# Patient Record
Sex: Female | Born: 1935 | Race: White | Hispanic: No | Marital: Married | Smoking: Former smoker
Health system: Southern US, Community
[De-identification: ages and names within clinical notes are randomized; demographics above are authoritative.]

## PROBLEM LIST (undated history)

## (undated) DIAGNOSIS — I4891 Unspecified atrial fibrillation: Secondary | ICD-10-CM

## (undated) DIAGNOSIS — I739 Peripheral vascular disease, unspecified: Secondary | ICD-10-CM

## (undated) DIAGNOSIS — F419 Anxiety disorder, unspecified: Secondary | ICD-10-CM

## (undated) DIAGNOSIS — F0390 Unspecified dementia without behavioral disturbance: Secondary | ICD-10-CM

## (undated) DIAGNOSIS — J449 Chronic obstructive pulmonary disease, unspecified: Secondary | ICD-10-CM

## (undated) HISTORY — PX: APPENDECTOMY: SHX54

---

## 2000-01-03 ENCOUNTER — Ambulatory Visit (HOSPITAL_COMMUNITY): Admission: RE | Admit: 2000-01-03 | Discharge: 2000-01-03 | Payer: Self-pay | Admitting: Internal Medicine

## 2000-01-03 ENCOUNTER — Encounter: Payer: Self-pay | Admitting: Internal Medicine

## 2014-09-20 ENCOUNTER — Encounter (HOSPITAL_COMMUNITY): Payer: Self-pay | Admitting: Emergency Medicine

## 2014-09-20 ENCOUNTER — Inpatient Hospital Stay (HOSPITAL_COMMUNITY): Payer: Medicare Other

## 2014-09-20 ENCOUNTER — Emergency Department (HOSPITAL_COMMUNITY): Payer: Medicare Other

## 2014-09-20 ENCOUNTER — Inpatient Hospital Stay (HOSPITAL_COMMUNITY)
Admission: EM | Admit: 2014-09-20 | Discharge: 2014-09-22 | DRG: 193 | Disposition: A | Discharge status: Hospice | Payer: Medicare Other | Attending: Internal Medicine | Admitting: Internal Medicine

## 2014-09-20 DIAGNOSIS — I4891 Unspecified atrial fibrillation: Secondary | ICD-10-CM | POA: Diagnosis present

## 2014-09-20 DIAGNOSIS — F0281 Dementia in other diseases classified elsewhere with behavioral disturbance: Secondary | ICD-10-CM | POA: Diagnosis present

## 2014-09-20 DIAGNOSIS — J9811 Atelectasis: Secondary | ICD-10-CM | POA: Diagnosis present

## 2014-09-20 DIAGNOSIS — J441 Chronic obstructive pulmonary disease with (acute) exacerbation: Secondary | ICD-10-CM | POA: Diagnosis present

## 2014-09-20 DIAGNOSIS — I129 Hypertensive chronic kidney disease with stage 1 through stage 4 chronic kidney disease, or unspecified chronic kidney disease: Secondary | ICD-10-CM | POA: Diagnosis present

## 2014-09-20 DIAGNOSIS — R791 Abnormal coagulation profile: Secondary | ICD-10-CM | POA: Diagnosis present

## 2014-09-20 DIAGNOSIS — E785 Hyperlipidemia, unspecified: Secondary | ICD-10-CM | POA: Diagnosis present

## 2014-09-20 DIAGNOSIS — R0902 Hypoxemia: Secondary | ICD-10-CM

## 2014-09-20 DIAGNOSIS — Z7901 Long term (current) use of anticoagulants: Secondary | ICD-10-CM

## 2014-09-20 DIAGNOSIS — Z79899 Other long term (current) drug therapy: Secondary | ICD-10-CM | POA: Diagnosis not present

## 2014-09-20 DIAGNOSIS — N184 Chronic kidney disease, stage 4 (severe): Secondary | ICD-10-CM | POA: Diagnosis present

## 2014-09-20 DIAGNOSIS — J209 Acute bronchitis, unspecified: Secondary | ICD-10-CM | POA: Diagnosis present

## 2014-09-20 DIAGNOSIS — E875 Hyperkalemia: Secondary | ICD-10-CM | POA: Diagnosis present

## 2014-09-20 DIAGNOSIS — I739 Peripheral vascular disease, unspecified: Secondary | ICD-10-CM | POA: Diagnosis present

## 2014-09-20 DIAGNOSIS — J9601 Acute respiratory failure with hypoxia: Secondary | ICD-10-CM | POA: Insufficient documentation

## 2014-09-20 DIAGNOSIS — R600 Localized edema: Secondary | ICD-10-CM | POA: Diagnosis present

## 2014-09-20 DIAGNOSIS — Z66 Do not resuscitate: Secondary | ICD-10-CM | POA: Diagnosis present

## 2014-09-20 DIAGNOSIS — I10 Essential (primary) hypertension: Secondary | ICD-10-CM | POA: Diagnosis present

## 2014-09-20 DIAGNOSIS — N179 Acute kidney failure, unspecified: Secondary | ICD-10-CM | POA: Diagnosis present

## 2014-09-20 DIAGNOSIS — I517 Cardiomegaly: Secondary | ICD-10-CM | POA: Diagnosis present

## 2014-09-20 DIAGNOSIS — R339 Retention of urine, unspecified: Secondary | ICD-10-CM | POA: Diagnosis present

## 2014-09-20 DIAGNOSIS — Q600 Renal agenesis, unilateral: Secondary | ICD-10-CM | POA: Diagnosis not present

## 2014-09-20 DIAGNOSIS — N189 Chronic kidney disease, unspecified: Secondary | ICD-10-CM

## 2014-09-20 DIAGNOSIS — Z515 Encounter for palliative care: Secondary | ICD-10-CM

## 2014-09-20 DIAGNOSIS — J449 Chronic obstructive pulmonary disease, unspecified: Secondary | ICD-10-CM | POA: Insufficient documentation

## 2014-09-20 DIAGNOSIS — J189 Pneumonia, unspecified organism: Secondary | ICD-10-CM | POA: Diagnosis present

## 2014-09-20 DIAGNOSIS — R0602 Shortness of breath: Secondary | ICD-10-CM | POA: Diagnosis present

## 2014-09-20 DIAGNOSIS — Z87891 Personal history of nicotine dependence: Secondary | ICD-10-CM

## 2014-09-20 DIAGNOSIS — T502X5A Adverse effect of carbonic-anhydrase inhibitors, benzothiadiazides and other diuretics, initial encounter: Secondary | ICD-10-CM | POA: Diagnosis present

## 2014-09-20 DIAGNOSIS — I481 Persistent atrial fibrillation: Secondary | ICD-10-CM

## 2014-09-20 DIAGNOSIS — I5043 Acute on chronic combined systolic (congestive) and diastolic (congestive) heart failure: Secondary | ICD-10-CM | POA: Diagnosis present

## 2014-09-20 DIAGNOSIS — I509 Heart failure, unspecified: Secondary | ICD-10-CM

## 2014-09-20 DIAGNOSIS — F0390 Unspecified dementia without behavioral disturbance: Secondary | ICD-10-CM | POA: Diagnosis present

## 2014-09-20 HISTORY — DX: Chronic obstructive pulmonary disease, unspecified: J44.9

## 2014-09-20 HISTORY — DX: Unspecified dementia, unspecified severity, without behavioral disturbance, psychotic disturbance, mood disturbance, and anxiety: F03.90

## 2014-09-20 HISTORY — DX: Peripheral vascular disease, unspecified: I73.9

## 2014-09-20 HISTORY — DX: Unspecified atrial fibrillation: I48.91

## 2014-09-20 HISTORY — DX: Anxiety disorder, unspecified: F41.9

## 2014-09-20 LAB — I-STAT CHEM 8, ED
BUN: 42 mg/dL — ABNORMAL HIGH (ref 6–23)
CREATININE: 1.5 mg/dL — AB (ref 0.50–1.10)
Calcium, Ion: 1.23 mmol/L (ref 1.13–1.30)
Chloride: 98 mmol/L (ref 96–112)
Glucose, Bld: 110 mg/dL — ABNORMAL HIGH (ref 70–99)
HCT: 45 % (ref 36.0–46.0)
HEMOGLOBIN: 15.3 g/dL — AB (ref 12.0–15.0)
POTASSIUM: 5.2 mmol/L — AB (ref 3.5–5.1)
Sodium: 142 mmol/L (ref 135–145)
TCO2: 33 mmol/L (ref 0–100)

## 2014-09-20 LAB — TROPONIN I
TROPONIN I: 0.03 ng/mL (ref <0.031)
Troponin I: 0.03 ng/mL (ref <0.031)
Troponin I: 0.03 ng/mL (ref <0.031)

## 2014-09-20 LAB — PROTIME-INR
INR: 5.61 — AB (ref 0.00–1.49)
PROTHROMBIN TIME: 51.2 s — AB (ref 11.6–15.2)

## 2014-09-20 LAB — COMPREHENSIVE METABOLIC PANEL
ALT: 42 U/L — AB (ref 0–35)
AST: 49 U/L — AB (ref 0–37)
Albumin: 3.8 g/dL (ref 3.5–5.2)
Alkaline Phosphatase: 70 U/L (ref 39–117)
Anion gap: 3 — ABNORMAL LOW (ref 5–15)
BUN: 40 mg/dL — ABNORMAL HIGH (ref 6–23)
CO2: 38 mmol/L — ABNORMAL HIGH (ref 19–32)
CREATININE: 1.55 mg/dL — AB (ref 0.50–1.10)
Calcium: 9.1 mg/dL (ref 8.4–10.5)
Chloride: 99 mmol/L (ref 96–112)
GFR calc Af Amer: 32 mL/min — ABNORMAL LOW (ref >90)
GFR calc non Af Amer: 28 mL/min — ABNORMAL LOW (ref >90)
Glucose, Bld: 110 mg/dL — ABNORMAL HIGH (ref 70–99)
Potassium: 5.2 mmol/L — ABNORMAL HIGH (ref 3.5–5.1)
Sodium: 140 mmol/L (ref 135–145)
TOTAL PROTEIN: 5.9 g/dL — AB (ref 6.0–8.3)
Total Bilirubin: 0.8 mg/dL (ref 0.3–1.2)

## 2014-09-20 LAB — CBC WITH DIFFERENTIAL/PLATELET
Basophils Absolute: 0 10*3/uL (ref 0.0–0.1)
Basophils Relative: 0 % (ref 0–1)
Eosinophils Absolute: 0.1 10*3/uL (ref 0.0–0.7)
Eosinophils Relative: 2 % (ref 0–5)
HCT: 45 % (ref 36.0–46.0)
Hemoglobin: 13.3 g/dL (ref 12.0–15.0)
Lymphocytes Relative: 10 % — ABNORMAL LOW (ref 12–46)
Lymphs Abs: 0.6 10*3/uL — ABNORMAL LOW (ref 0.7–4.0)
MCH: 27.1 pg (ref 26.0–34.0)
MCHC: 29.6 g/dL — ABNORMAL LOW (ref 30.0–36.0)
MCV: 91.8 fL (ref 78.0–100.0)
Monocytes Absolute: 1 10*3/uL (ref 0.1–1.0)
Monocytes Relative: 18 % — ABNORMAL HIGH (ref 3–12)
Neutro Abs: 4 10*3/uL (ref 1.7–7.7)
Neutrophils Relative %: 70 % (ref 43–77)
Platelets: 159 10*3/uL (ref 150–400)
RBC: 4.9 MIL/uL (ref 3.87–5.11)
RDW: 17.8 % — ABNORMAL HIGH (ref 11.5–15.5)
WBC: 5.7 10*3/uL (ref 4.0–10.5)

## 2014-09-20 LAB — I-STAT TROPONIN, ED: TROPONIN I, POC: 0.02 ng/mL (ref 0.00–0.08)

## 2014-09-20 LAB — BASIC METABOLIC PANEL
Anion gap: 5 (ref 5–15)
BUN: 44 mg/dL — ABNORMAL HIGH (ref 6–23)
CHLORIDE: 100 mmol/L (ref 96–112)
CO2: 39 mmol/L — AB (ref 19–32)
CREATININE: 1.73 mg/dL — AB (ref 0.50–1.10)
Calcium: 8.5 mg/dL (ref 8.4–10.5)
GFR calc non Af Amer: 24 mL/min — ABNORMAL LOW (ref >90)
GFR, EST AFRICAN AMERICAN: 28 mL/min — AB (ref >90)
GLUCOSE: 171 mg/dL — AB (ref 70–99)
Potassium: 5.1 mmol/L (ref 3.5–5.1)
Sodium: 144 mmol/L (ref 135–145)

## 2014-09-20 LAB — BRAIN NATRIURETIC PEPTIDE: B NATRIURETIC PEPTIDE 5: 1425.4 pg/mL — AB (ref 0.0–100.0)

## 2014-09-20 LAB — MRSA PCR SCREENING: MRSA BY PCR: NEGATIVE

## 2014-09-20 LAB — SODIUM, URINE, RANDOM: Sodium, Ur: 89 mEq/L

## 2014-09-20 LAB — CREATININE, URINE, RANDOM: Creatinine, Urine: 77.2 mg/dL

## 2014-09-20 LAB — STREP PNEUMONIAE URINARY ANTIGEN: STREP PNEUMO URINARY ANTIGEN: NEGATIVE

## 2014-09-20 MED ORDER — CETYLPYRIDINIUM CHLORIDE 0.05 % MT LIQD
7.0000 mL | Freq: Two times a day (BID) | OROMUCOSAL | Status: DC
  Administered 2014-09-20 – 2014-09-22 (×5): 7 mL via OROMUCOSAL

## 2014-09-20 MED ORDER — PANTOPRAZOLE SODIUM 40 MG PO TBEC
40.0000 mg | DELAYED_RELEASE_TABLET | Freq: Every day | ORAL | Status: DC
  Administered 2014-09-20 – 2014-09-21 (×2): 40 mg via ORAL
  Filled 2014-09-20 (×3): qty 1

## 2014-09-20 MED ORDER — RESOURCE INSTANT PROTEIN PO PWD PACKET
2.0000 | Freq: Three times a day (TID) | ORAL | Status: DC
  Administered 2014-09-20 – 2014-09-21 (×4): 12 g via ORAL
  Filled 2014-09-20: qty 227

## 2014-09-20 MED ORDER — METHYLPREDNISOLONE SODIUM SUCC 40 MG IJ SOLR
40.0000 mg | Freq: Three times a day (TID) | INTRAMUSCULAR | Status: DC
  Administered 2014-09-20 – 2014-09-21 (×3): 40 mg via INTRAVENOUS
  Filled 2014-09-20 (×4): qty 1

## 2014-09-20 MED ORDER — CARVEDILOL 3.125 MG PO TABS
3.1250 mg | ORAL_TABLET | Freq: Two times a day (BID) | ORAL | Status: DC
  Administered 2014-09-20 – 2014-09-22 (×4): 3.125 mg via ORAL
  Filled 2014-09-20 (×4): qty 1

## 2014-09-20 MED ORDER — METHYLPREDNISOLONE SODIUM SUCC 125 MG IJ SOLR: 60.0000 mg | Freq: Three times a day (TID) | INTRAMUSCULAR | Status: DC

## 2014-09-20 MED ORDER — POLYVINYL ALCOHOL 1.4 % OP SOLN
1.0000 [drp] | Freq: Three times a day (TID) | OPHTHALMIC | Status: DC
  Administered 2014-09-20 – 2014-09-22 (×7): 1 [drp] via OPHTHALMIC
  Filled 2014-09-20: qty 15

## 2014-09-20 MED ORDER — GUAIFENESIN 200 MG PO TABS
400.0000 mg | ORAL_TABLET | Freq: Four times a day (QID) | ORAL | Status: DC | PRN
  Filled 2014-09-20: qty 2

## 2014-09-20 MED ORDER — SODIUM POLYSTYRENE SULFONATE 15 GM/60ML PO SUSP
15.0000 g | Freq: Once | ORAL | Status: AC
  Administered 2014-09-20: 15 g via ORAL
  Filled 2014-09-20: qty 60

## 2014-09-20 MED ORDER — PREDNISONE 20 MG PO TABS
60.0000 mg | ORAL_TABLET | Freq: Once | ORAL | Status: AC
  Administered 2014-09-20: 60 mg via ORAL
  Filled 2014-09-20: qty 3

## 2014-09-20 MED ORDER — FUROSEMIDE 10 MG/ML IJ SOLN
40.0000 mg | Freq: Once | INTRAMUSCULAR | Status: AC
  Administered 2014-09-20: 40 mg via INTRAVENOUS
  Filled 2014-09-20: qty 4

## 2014-09-20 MED ORDER — PRAVASTATIN SODIUM 20 MG PO TABS
80.0000 mg | ORAL_TABLET | Freq: Every day | ORAL | Status: DC
  Administered 2014-09-20 – 2014-09-21 (×2): 80 mg via ORAL
  Filled 2014-09-20 (×3): qty 4

## 2014-09-20 MED ORDER — HYPROMELLOSE (GONIOSCOPIC) 2.5 % OP SOLN: 1.0000 [drp] | Freq: Three times a day (TID) | OPHTHALMIC | Status: DC

## 2014-09-20 MED ORDER — ALBUTEROL SULFATE (2.5 MG/3ML) 0.083% IN NEBU
2.5000 mg | INHALATION_SOLUTION | RESPIRATORY_TRACT | Status: DC | PRN
  Administered 2014-09-20: 2.5 mg via RESPIRATORY_TRACT
  Filled 2014-09-20: qty 3

## 2014-09-20 MED ORDER — GUAIFENESIN ER 600 MG PO TB12
600.0000 mg | ORAL_TABLET | Freq: Two times a day (BID) | ORAL | Status: DC
  Administered 2014-09-20 – 2014-09-21 (×3): 600 mg via ORAL
  Filled 2014-09-20 (×4): qty 1

## 2014-09-20 MED ORDER — GUAIFENESIN-DM 100-10 MG/5ML PO SYRP
5.0000 mL | ORAL_SOLUTION | ORAL | Status: DC | PRN
  Administered 2014-09-20 (×2): 5 mL via ORAL
  Filled 2014-09-20 (×2): qty 10

## 2014-09-20 MED ORDER — MIRTAZAPINE 15 MG PO TABS
15.0000 mg | ORAL_TABLET | Freq: Every day | ORAL | Status: DC
  Administered 2014-09-20: 15 mg via ORAL
  Filled 2014-09-20 (×2): qty 1

## 2014-09-20 MED ORDER — AMLODIPINE BESYLATE 10 MG PO TABS
10.0000 mg | ORAL_TABLET | Freq: Every day | ORAL | Status: DC
  Administered 2014-09-20 – 2014-09-21 (×2): 10 mg via ORAL
  Filled 2014-09-20 (×3): qty 1

## 2014-09-20 MED ORDER — NUTRITIONAL SUPPLEMENT PO LIQD: 1.0000 | Freq: Three times a day (TID) | ORAL | Status: DC

## 2014-09-20 MED ORDER — FOLIC ACID 1 MG PO TABS
1.0000 mg | ORAL_TABLET | ORAL | Status: DC
  Administered 2014-09-20 – 2014-09-22 (×3): 1 mg via ORAL
  Filled 2014-09-20 (×3): qty 1

## 2014-09-20 MED ORDER — IPRATROPIUM-ALBUTEROL 0.5-2.5 (3) MG/3ML IN SOLN
3.0000 mL | RESPIRATORY_TRACT | Status: DC
  Administered 2014-09-20: 3 mL via RESPIRATORY_TRACT

## 2014-09-20 MED ORDER — ENSURE COMPLETE PO LIQD
237.0000 mL | Freq: Three times a day (TID) | ORAL | Status: DC
  Administered 2014-09-20 – 2014-09-21 (×3): 237 mL via ORAL

## 2014-09-20 MED ORDER — ALBUTEROL (5 MG/ML) CONTINUOUS INHALATION SOLN
10.0000 mg/h | INHALATION_SOLUTION | RESPIRATORY_TRACT | Status: DC
  Administered 2014-09-20: 10 mg/h via RESPIRATORY_TRACT
  Filled 2014-09-20: qty 20

## 2014-09-20 MED ORDER — DONEPEZIL HCL 10 MG PO TABS
10.0000 mg | ORAL_TABLET | ORAL | Status: DC
  Administered 2014-09-20 – 2014-09-22 (×3): 10 mg via ORAL
  Filled 2014-09-20 (×3): qty 1

## 2014-09-20 MED ORDER — BENZONATATE 100 MG PO CAPS
200.0000 mg | ORAL_CAPSULE | Freq: Three times a day (TID) | ORAL | Status: DC
  Administered 2014-09-20 – 2014-09-21 (×5): 200 mg via ORAL
  Filled 2014-09-20 (×7): qty 2

## 2014-09-20 MED ORDER — IPRATROPIUM-ALBUTEROL 0.5-2.5 (3) MG/3ML IN SOLN
3.0000 mL | Freq: Four times a day (QID) | RESPIRATORY_TRACT | Status: DC
Start: 2014-09-20 — End: 2014-09-22
  Administered 2014-09-20 – 2014-09-22 (×8): 3 mL via RESPIRATORY_TRACT
  Filled 2014-09-20 (×8): qty 3

## 2014-09-20 MED ORDER — CALCIUM CARBONATE ANTACID 500 MG PO CHEW: 2.0000 | CHEWABLE_TABLET | Freq: Every day | ORAL | Status: DC | PRN

## 2014-09-20 MED ORDER — PNEUMOCOCCAL VAC POLYVALENT 25 MCG/0.5ML IJ INJ
0.5000 mL | INJECTION | INTRAMUSCULAR | Status: AC
  Administered 2014-09-21: 0.5 mL via INTRAMUSCULAR
  Filled 2014-09-20 (×2): qty 0.5

## 2014-09-20 MED ORDER — AZITHROMYCIN 250 MG PO TABS: 250.0000 mg | ORAL_TABLET | Freq: Every day | ORAL | Status: DC

## 2014-09-20 MED ORDER — AZITHROMYCIN 250 MG PO TABS
500.0000 mg | ORAL_TABLET | Freq: Once | ORAL | Status: AC
  Administered 2014-09-20: 500 mg via ORAL
  Filled 2014-09-20: qty 2

## 2014-09-20 MED ORDER — ACETAMINOPHEN 500 MG PO TABS: 500.0000 mg | ORAL_TABLET | Freq: Four times a day (QID) | ORAL | Status: DC | PRN

## 2014-09-20 MED ORDER — MAGNESIUM SULFATE 2 GM/50ML IV SOLN
2.0000 g | Freq: Once | INTRAVENOUS | Status: AC
  Administered 2014-09-20: 2 g via INTRAVENOUS
  Filled 2014-09-20: qty 50

## 2014-09-20 MED ORDER — ACETAMINOPHEN 500 MG PO TABS
500.0000 mg | ORAL_TABLET | Freq: Four times a day (QID) | ORAL | Status: DC | PRN
  Administered 2014-09-20: 500 mg via ORAL
  Filled 2014-09-20: qty 1

## 2014-09-20 MED ORDER — VANCOMYCIN HCL 500 MG IV SOLR
500.0000 mg | INTRAVENOUS | Status: DC
  Administered 2014-09-22: 500 mg via INTRAVENOUS
  Filled 2014-09-20: qty 500

## 2014-09-20 MED ORDER — PROMOD PO POWD: 2.0000 | Freq: Three times a day (TID) | ORAL | Status: DC

## 2014-09-20 MED ORDER — IPRATROPIUM BROMIDE 0.02 % IN SOLN
1.0000 mg | Freq: Once | RESPIRATORY_TRACT | Status: AC
  Administered 2014-09-20: 1 mg via RESPIRATORY_TRACT
  Filled 2014-09-20: qty 5

## 2014-09-20 MED ORDER — VANCOMYCIN HCL IN DEXTROSE 1-5 GM/200ML-% IV SOLN
1000.0000 mg | Freq: Once | INTRAVENOUS | Status: AC
  Administered 2014-09-20: 1000 mg via INTRAVENOUS
  Filled 2014-09-20: qty 200

## 2014-09-20 MED ORDER — HYDROXYZINE HCL 25 MG PO TABS: 25.0000 mg | ORAL_TABLET | Freq: Four times a day (QID) | ORAL | Status: DC | PRN

## 2014-09-20 MED ORDER — ESCITALOPRAM OXALATE 20 MG PO TABS
10.0000 mg | ORAL_TABLET | Freq: Every day | ORAL | Status: DC
  Administered 2014-09-20 – 2014-09-21 (×2): 10 mg via ORAL
  Filled 2014-09-20 (×3): qty 1

## 2014-09-20 MED ORDER — HYDROCORTISONE 0.5 % EX CREA
1.0000 "application " | TOPICAL_CREAM | Freq: Two times a day (BID) | CUTANEOUS | Status: DC | PRN
  Filled 2014-09-20: qty 28.35

## 2014-09-20 MED ORDER — PIPERACILLIN-TAZOBACTAM IN DEX 2-0.25 GM/50ML IV SOLN
2.2500 g | Freq: Four times a day (QID) | INTRAVENOUS | Status: DC
  Administered 2014-09-20 – 2014-09-22 (×8): 2.25 g via INTRAVENOUS
  Filled 2014-09-20 (×11): qty 50

## 2014-09-20 MED ORDER — OSELTAMIVIR PHOSPHATE 30 MG PO CAPS
30.0000 mg | ORAL_CAPSULE | Freq: Every day | ORAL | Status: DC
  Administered 2014-09-20 – 2014-09-21 (×2): 30 mg via ORAL
  Filled 2014-09-20 (×3): qty 1

## 2014-09-20 NOTE — H&P (Signed)
[facility] History and Physical  Sarah Short ZOX:[id number] DOB: 05/26/1920 DOA: 09/20/2014  Referring physician: ED physician PCP: Malka So., MD  Specialists:   Chief Complaint: Cough, shortness of breath, leg swelling  HPI: Sarah Short is a 81 y.o. female with past medical history of COPD, dementia, atrial fibrillation on Coumadin, hypertension, hyperlipidemia, congenital solitary kidney, who presents with cough, shortness of breath and leg swelling.  Patient is accompanied by her son. Per her son, patient has been coughing for about 1 week. He has shortness breath and respiratory distress, but no fever, chills or chest pain. Her cough is nonproductive.   Patient is also noted to have bilateral leg swelling recently. No history of congestive heart failure. Patient doe not have fever, chills, chest pain, abdominal pain, diarrhea, constipation, dysuria, urgency, frequency, hematuria.   Work up in the ED demonstrates AKI with Cre 1.50 (no previous cre on record), negative troponin. INR 5.61, no leukocytosis. Body temperature normal. X-rays negative for acute abnormalities. Patient is admitted to inpatient for further evaluation and treatment.  Review of Systems: As presented in the history of presenting illness, rest negative.  Where does patient live?  At home Can patient participate in ADLs? none  Allergy: No Known Allergies  Past Medical History  Diagnosis Date  . Presenile dementia   . COPD (chronic obstructive pulmonary disease)   . Anxiety   . PVD (peripheral vascular disease)   . Atrial fibrillation     Past Surgical History  Procedure Laterality Date  . Appendectomy      Social History:  reports that she quit smoking about 20 years ago. She has never used smokeless tobacco. She reports that she does not drink alcohol or use illicit drugs.  Family History: unable to get due to dementia  Prior to Admission medications   Medication Sig Start Date End  Date Taking? Authorizing Provider  acetaminophen (TYLENOL) 500 MG tablet Take 500 mg by mouth every 6 (six) hours as needed for mild pain.   Yes Historical Provider, MD  albuterol (PROVENTIL) (2.5 MG/3ML) 0.083% nebulizer solution Take 2.5 mg by nebulization every 4 (four) hours as needed for wheezing or shortness of breath.   Yes Historical Provider, MD  amLODipine (NORVASC) 5 MG tablet Take 5 mg by mouth daily.   Yes Historical Provider, MD  calcium carbonate (TUMS - DOSED IN MG ELEMENTAL CALCIUM) 500 MG chewable tablet Chew 2 tablets by mouth daily as needed for indigestion or heartburn.   Yes Historical Provider, MD  Calcium-Vitamin D (CALTRATE 600 PLUS-VIT D PO) Take 1 tablet by mouth 2 (two) times daily.   Yes Historical Provider, MD  carvedilol (COREG) 3.125 MG tablet Take 3.125 mg by mouth 2 (two) times daily with a meal.   Yes Historical Provider, MD  donepezil (ARICEPT) 10 MG tablet Take 10 mg by mouth every morning.   Yes Historical Provider, MD  escitalopram (LEXAPRO) 10 MG tablet Take 10 mg by mouth daily.   Yes Historical Provider, MD  folic acid (FOLVITE) 1 MG tablet Take 1 mg by mouth every morning.   Yes Historical Provider, MD  guaifenesin (HUMIBID E) 400 MG TABS tablet Take 400 mg by mouth every 6 (six) hours as needed (cough).   Yes Historical Provider, MD  guaifenesin (ROBITUSSIN) 100 MG/5ML syrup Take 200 mg by mouth every 6 (six) hours as needed for cough.   Yes Historical Provider, MD  hydrocortisone cream 0.5 % Apply 1 application topically 2 (two) times daily  as needed for itching (to forehead).   Yes Historical Provider, MD  hydrocortisone valerate cream (WESTCORT) 0.2 % Apply 1 application topically daily.   Yes Historical Provider, MD  hydroxypropyl methylcellulose / hypromellose (ISOPTO TEARS / GONIOVISC) 2.5 % ophthalmic solution Place 1 drop into both eyes 3 (three) times daily.   Yes Historical Provider, MD  hydrOXYzine (ATARAX/VISTARIL) 25 MG tablet Take 25 mg by  mouth 4 (four) times daily as needed for anxiety.   Yes Historical Provider, MD  lisinopril (PRINIVIL,ZESTRIL) 5 MG tablet Take 5 mg by mouth daily.   Yes Historical Provider, MD  magnesium oxide (MAG-OX) 400 MG tablet Take 800 mg by mouth 2 (two) times daily.   Yes Historical Provider, MD  metroNIDAZOLE (METROCREAM) 0.75 % cream Apply 1 application topically 2 (two) times daily as needed (for face).   Yes Historical Provider, MD  mirtazapine (REMERON) 15 MG tablet Take 15 mg by mouth at bedtime.   Yes Historical Provider, MD  Multiple Vitamins-Minerals (ICAPS AREDS FORMULA PO) Take 1 capsule by mouth every morning.   Yes Historical Provider, MD  NUTRITIONAL SUPPLEMENT LIQD Take 1 Can by mouth 3 (three) times daily.   Yes Historical Provider, MD  omeprazole (PRILOSEC) 20 MG capsule Take 20 mg by mouth every morning.   Yes Historical Provider, MD  pravastatin (PRAVACHOL) 80 MG tablet Take 80 mg by mouth every morning.   Yes Historical Provider, MD  protein supplement (PROMOD) POWD Take 2 scoop by mouth 3 (three) times daily with meals.   Yes Historical Provider, MD  warfarin (COUMADIN) 1 MG tablet Take 1 mg by mouth daily. along with 2.5mg  tab to equal 3.5mg    Yes Historical Provider, MD  warfarin (COUMADIN) 2.5 MG tablet Take 2.5 mg by mouth daily. Along with 1mg  tab to equal 3.5mg    Yes Historical Provider, MD    Physical Exam: Filed Vitals:   09/20/14 0430 09/20/14 0445 09/20/14 0545 09/20/14 0612  BP: 137/68 128/70 123/53   Pulse: 63     Temp:      TempSrc:      Resp: 15 17 23    Height:      Weight:      SpO2: 100%   92%   General: Not in acute distress HEENT:       Eyes: PERRL, EOMI, no scleral icterus       ENT: No discharge from the ears and nose, no pharynx injection, no tonsillar enlargement.        Neck: No JVD, no bruit, no mass felt. Cardiac: S1/S2, RRR, No murmurs, No gallops or rubs Pulm: diffused rhonchi and wheezing bilaterally. No rale or rubs. Abd: Soft,  nondistended, nontender, no rebound pain, no organomegaly, BS present Ext: 2+ pitting leg edema bilaterally. 2+DP/PT pulse bilaterally Musculoskeletal: No joint deformities, erythema, or stiffness, ROM full Skin: No rashes.  Neuro: Alert and oriented X3, cranial nerves II-XII grossly intact, muscle strength 5/5 in all extremeties, sensation to light touch intact.  Psych: Patient is not psychotic, no suicidal or hemocidal ideation.  Labs on Admission:  Basic Metabolic Panel:  Recent Labs Lab 09/20/14 0348 09/20/14 0409  NA 140 142  K 5.2* 5.2*  CL 99 98  CO2 38*  --   GLUCOSE 110* 110*  BUN 40* 42*  CREATININE 1.55* 1.50*  CALCIUM 9.1  --    Liver Function Tests:  Recent Labs Lab 09/20/14 0348  AST 49*  ALT 42*  ALKPHOS 70  BILITOT 0.8  PROT 5.9*  ALBUMIN 3.8   No results for input(s): LIPASE, AMYLASE in the last 168 hours. No results for input(s): AMMONIA in the last 168 hours. CBC:  Recent Labs Lab 09/20/14 0347 09/20/14 0409  WBC 5.7  --   NEUTROABS 4.0  --   HGB 13.3 15.3*  HCT 45.0 45.0  MCV 91.8  --   PLT 159  --    Cardiac Enzymes: No results for input(s): CKTOTAL, CKMB, CKMBINDEX, TROPONINI in the last 168 hours.  BNP (last 3 results) No results for input(s): PROBNP in the last 8760 hours. CBG: No results for input(s): GLUCAP in the last 168 hours.  Radiological Exams on Admission: Dg Chest 2 View  09/20/2014   CLINICAL DATA:  Shortness of breath. Coughing since Wednesday or longer. Previous smoker.  EXAM: CHEST  2 VIEW  COMPARISON:  06/10/2014  FINDINGS: Cardiac enlargement without vascular congestion or edema. Small bilateral pleural effusions with basilar atelectasis. No focal consolidation in the lungs. No pneumothorax. Calcified and tortuous aorta. Old fracture deformity of the right humerus.  IMPRESSION: Cardiac enlargement. Small bilateral pleural effusions with basilar atelectasis.   Electronically Signed   By: Burman Nieves M.D.   On:  09/20/2014 03:40    EKG: Independently reviewed.   Assessment/Plan Principal Problem:   COPD exacerbation Active Problems:   PVD (peripheral vascular disease)   Presenile dementia   Atrial fibrillation   HTN (hypertension)   HLD (hyperlipidemia)   Bilateral leg edema   Solitary kidney, congenital   AKI (acute kidney injury)  COPD exacerbation: Chest x-rays is negative for infiltration. Troponin negative. Another possibility is that patient may have undiagnosed congestive heart failure given her bilateral leg edema, which may have contributed to her shortness of breath. Unlikely to have PE given INR=5.61 and no chest pain.  -will admit patient to tSDU -Nebulizers: scheduled Duoneb and prn albuterol -Solu-Medrol 60 mg IV q8h  -Oral azithromycin for 5 days.  -Blood and sputum culture, respiratory viral panel - lasix 40 mg iv x 1  Possible CHF: Patient has bilateral leg edema. Differential diagnosis including acute renal failure and possible congestive heart failure. -Check 2-D echo -Check BNP -one dose of Lasix, 40 mg 1  Atrial Fibrillation: CHA2DS2-VASc Score is 5. Patient is on coumadin at home. INR is 5.61 on admission. No bleeding tendency. HR is well controlled. -Coumadin management per pharmacy - continue Coreg, hold morning dose given bradycardia  AKI: Patient has congenital solitary kidney. Creatinine is 1.50, not sure it is chronic issue or acute problem. -will hold lisinopril -check US-renal and FeNA -monitor renal Fx closely after lasix  HLD: No LDL on record. -Check lipid profile. -Continue pravastatin  Hyperkalemia: Potassium 5.2. No EKG T wave change. -Kayexalate, 10 mg 1  HTN:  -Will hold lisinopril -Increase amlodipine dose from 5 mg daily to 10 mg daily. -on lasix  Dementia: -continue to Tapazole  DVT ppx: SQ Heparin   Code Status: DNR Family Communication:  Yes, patient's    son   at bed side Disposition Plan: Admit to inpatient   Date of  Service 09/20/2014    Lorretta Harp [facility] Pager [phone]  If 7PM-7AM, please contact night-coverage [email or web address] Password [email or web address] 09/20/2014, 7:07 AM

## 2014-09-20 NOTE — Progress Notes (Signed)
PT Cancellation Note  Patient Details Name: Nydia BoutonSara V Neer MRN: [id number] DOB: 19-Dec-1919   Cancelled Treatment:    Reason Eval/Treat Not Completed: Patient not medically ready; pt just adm this day, INR elevated at 5.61, RR 29 at rest, will defer PT  eval until next day or as schedule and medical issues permit.   [facility] CenterWILLIAMS,Florencio Hollibaugh 09/20/2014, 10:29 AM

## 2014-09-20 NOTE — Progress Notes (Signed)
  Echocardiogram 2D Echocardiogram has been performed.  Aris EvertsRix, Tari Lecount A 09/20/2014, 1:01 PM

## 2014-09-20 NOTE — Progress Notes (Signed)
ANTIBIOTIC CONSULT NOTE - INITIAL  Pharmacy Consult for vancomycin/zosyn Indication: rule out pneumonia  No Known Allergies  Patient Measurements: Height: 5\' 3"  (160 cm) Weight: 103 lb (46.72 kg) IBW/kg (Calculated) : 52.4 Adjusted Body Weight:   Vital Signs: Temp: 98 F (36.7 C) (01/31 0718) Temp Source: Axillary (01/31 0718) BP: 113/66 mmHg (01/31 0821) Pulse Rate: 62 (01/31 0821) Intake/Output from previous day:   Intake/Output from this shift:    Labs:  Recent Labs  09/20/14 0347 09/20/14 0348 09/20/14 0409  WBC 5.7  --   --   HGB 13.3  --  15.3*  PLT 159  --   --   CREATININE  --  1.55* 1.50*   Estimated Creatinine Clearance: 17.3 mL/min (by C-G formula based on Cr of 1.5). No results for input(s): VANCOTROUGH, VANCOPEAK, VANCORANDOM, GENTTROUGH, GENTPEAK, GENTRANDOM, TOBRATROUGH, TOBRAPEAK, TOBRARND, AMIKACINPEAK, AMIKACINTROU, AMIKACIN in the last 72 hours.   Microbiology: No results found for this or any previous visit (from the past 720 hour(s)).  Medical History: Past Medical History  Diagnosis Date  . Presenile dementia   . COPD (chronic obstructive pulmonary disease)   . Anxiety   . PVD (peripheral vascular disease)   . Atrial fibrillation    Assessment: [age] YOF with non-productive cough, SOB/respiratory distress x 1 week.  [facility]  Concern for HCAP and/or AECOPD, no CXR was seen on CXR at admission.  Pharmacy asked to dose vancomycin and zosyn.  1/31 >> vancomycin  >> 1/31 >> zosyn >>  1/31 >> tamiflu >> 1/31 >> azithromycin >> 1/31   Tmax: afeb WBCs: WNL Renal: SCr elevated, 1.5 for est Normalized CrCl = 227ml/min  1/31 blood: pending 1/31 Legionella Ag: pending 1/31 S. pneumo Ag: pending / sputum: not collected 1/31 resp virus panel: pending  Goal of Therapy:  Vancomycin trough level 15-20 mcg/ml  Plan:   Vancomycin 1gm IV x 1 then 500mg  IV q48h  Change to 500mg  IV q24h if renal function continues to  improve  Zosyn 2.25gm IV q6h  Follow renal function and adjust doses as appropriate  Check vancomycin trough as indicated   Juliette Alcideustin Zeigler, PharmD, BCPS.   Pager: [phone]  09/20/2014,8:23 AM

## 2014-09-20 NOTE — ED Notes (Signed)
Son states that pt c/o last Wednesday, pt c/o being queasy and feeling sick; son states pt has COPD but sounded a little worse than normal (respiratory sounds) and has issues with hiccups and burping.

## 2014-09-20 NOTE — ED Notes (Signed)
Bed: WA11 Expected date:  Expected time:  Means of arrival:  Comments: EMS/shob 

## 2014-09-20 NOTE — ED Notes (Signed)
Received report from Terrance, RN 

## 2014-09-20 NOTE — ED Provider Notes (Addendum)
CSN: [id number]     Arrival date & time 09/20/14  0201 History   First MD Initiated Contact with Patient 09/20/14 0250     Chief Complaint  Patient presents with  . Shortness of Breath     (Consider location/radiation/quality/duration/timing/severity/associated sxs/prior Treatment) HPI  Sarah Short is a 81 y.o. female with past medical history of COPD and atrial fibrillation presenting today with shortness of breath. She's had these symptoms for the past 5 days. She denies any fevers. She is denying any chest pain to me although son states she told the facility she did have chest pain. She denies any change to her chronic cough. Nothing makes the symptoms better or worse. She denies recent infections or hospitalizations. Patient has no further complaints.  10 Systems reviewed and are negative for acute change except as noted in the HPI.      Past Medical History  Diagnosis Date  . Presenile dementia   . COPD (chronic obstructive pulmonary disease)   . Anxiety   . PVD (peripheral vascular disease)   . Atrial fibrillation    Past Surgical History  Procedure Laterality Date  . Appendectomy     History reviewed. No pertinent family history. History  Substance Use Topics  . Smoking status: Former Smoker    Quit date: 09/20/1994  . Smokeless tobacco: Never Used  . Alcohol Use: No   OB History    No data available     Review of Systems    Allergies  Review of patient's allergies indicates no known allergies.  Home Medications   Prior to Admission medications   Not on File   BP 174/44 mmHg  Pulse 64  Temp(Src) 98.2 F (36.8 C) (Oral)  Resp 20  Ht  (1.6 m)  Wt 103 lb (46.72 kg)  BMI 18.25 kg/m2  SpO2 92% Physical Exam  Constitutional: She is oriented to person, place, and time. She appears well-developed and well-nourished. She appears distressed.  HENT:  Head: Normocephalic and atraumatic.  Nose: Nose normal.  Mouth/Throat: Oropharynx is clear and  moist. No oropharyngeal exudate.  Eyes: Conjunctivae and EOM are normal. Pupils are equal, round, and reactive to light. No scleral icterus.  Neck: Normal range of motion. Neck supple. No JVD present. No tracheal deviation present. No thyromegaly present.  Cardiovascular: Normal rate, regular rhythm and normal heart sounds.  Exam reveals no gallop and no friction rub.   No murmur heard. Pulmonary/Chest: She is in respiratory distress. She has no wheezes. She exhibits no tenderness.  Rhonchi heard, increased work of breathing, use of accessory muscles.  Abdominal: Soft. Bowel sounds are normal. She exhibits no distension and no mass. There is no tenderness. There is no rebound and no guarding.  Musculoskeletal: Normal range of motion. She exhibits no edema or tenderness.  Lymphadenopathy:    She has no cervical adenopathy.  Neurological: She is alert and oriented to person, place, and time. No cranial nerve deficit. She exhibits normal muscle tone.  Skin: Skin is warm and dry. No rash noted. She is not diaphoretic. No erythema. No pallor.  Nursing note and vitals reviewed.   ED Course  Procedures (including critical care time) Labs Review Labs Reviewed  CBC WITH DIFFERENTIAL/PLATELET - Abnormal; Notable for the following:    MCHC 29.6 (*)    RDW 17.8 (*)    Lymphocytes Relative 10 (*)    Lymphs Abs 0.6 (*)    Monocytes Relative 18 (*)    All other components  within normal limits  I-STAT CHEM 8, ED - Abnormal; Notable for the following:    Potassium 5.2 (*)    BUN 42 (*)    Creatinine, Ser 1.50 (*)    Glucose, Bld 110 (*)    Hemoglobin 15.3 (*)    All other components within normal limits  CULTURE, BLOOD (ROUTINE X 2)  CULTURE, BLOOD (ROUTINE X 2)  PROTIME-INR  BRAIN NATRIURETIC PEPTIDE  COMPREHENSIVE METABOLIC PANEL  Rosezena SensorI-STAT TROPOININ, ED    Imaging Review Dg Chest 2 View  09/20/2014   CLINICAL DATA:  Shortness of breath. Coughing since Wednesday or longer. Previous smoker.   EXAM: CHEST  2 VIEW  COMPARISON:  06/10/2014  FINDINGS: Cardiac enlargement without vascular congestion or edema. Small bilateral pleural effusions with basilar atelectasis. No focal consolidation in the lungs. No pneumothorax. Calcified and tortuous aorta. Old fracture deformity of the right humerus.  IMPRESSION: Cardiac enlargement. Small bilateral pleural effusions with basilar atelectasis.   Electronically Signed   By: Burman NievesWilliam  Stevens M.D.   On: 09/20/2014 03:40     EKG Interpretation   Date/Time:  Sunday September 20 2014 03:06:55 EST Ventricular Rate:  62 PR Interval:    QRS Duration: 104 QT Interval:  408 QTC Calculation: 414 R Axis:   29 Text Interpretation:  Atrial fibrillation Anterior infarct, old Confirmed  by Erroll Lunani, Peggyann Zwiefelhofer [place]unle [phone]([id number]) on 09/20/2014 3:24:37 AM      MDM   Final diagnoses:  Shortness of breath    Patient since emergency department for worsening shortness of breath over the course of 5 days. Physical exam reveals rhonchorous lung sounds as well as a chronic cough. She is hypoxic on room air, 2 L nasal cannula and she is satting 92%. She was given 3 breathing treatments, magnesium, steroids for treatment. Blood cultures drawn and azithromycin given for possible pneumonia. Patient will be retained in the hospital for continued management of her COPD exacerbation.  CRITICAL CARE Performed by: Tomasita CrumbleNI,Dionicia Cerritos   Total critical care time: 35min - respiratory distress  Critical care time was exclusive of separately billable procedures and treating other patients.  Critical care was necessary to treat or prevent imminent or life-threatening deterioration.  Critical care was time spent personally by me on the following activities: development of treatment plan with patient and/or surrogate as well as nursing, discussions with consultants, evaluation of patient's response to treatment, examination of patient, obtaining history from patient or surrogate, ordering and  performing treatments and interventions, ordering and review of laboratory studies, ordering and review of radiographic studies, pulse oximetry and re-evaluation of patient's condition.     Tomasita CrumbleAdeleke Raivyn Kabler, MD 09/20/14 [id number]  Tomasita CrumbleAdeleke Naidelyn Parrella, MD 09/20/14 [phone]

## 2014-09-20 NOTE — Progress Notes (Signed)
PROGRESS NOTE    Sarah BoutonSara V Short VWU:[id number] DOB: Mar 12, 1920 DOA: 09/20/2014 PCP: Sarah Short,[name], MD  HPI/Brief narrative 81 year old female, ALF resident, not on home oxygen, former heavy smoker, COPD, advanced dementia, A. fib on Coumadin, HTN, HLD, congenital solitary kidney, possible chronic kidney disease, ambulates with the help of a walker, presented with 1 week off mostly nonproductive cough, progressive dyspnea, wheezing and bilateral leg edema. No reported fevers or swallowing difficulties. In the ED, creatinine 1.5, potassium 5.2, troponin 1 negative, INR 5.6 mild transaminitis, chest x-ray showed cardiomegaly, small bilateral pleural effusions with bibasal atelectasis. Admitted for possible COPD/CHF exacerbation.    Assessment/Plan:  1. Acute bronchitis with COPD exacerbation: Likely precipitated by viral URI. Could still be developing pneumonia. Follow urine Legionella and streptococcal antigen and RSV panel. Will broaden antibiotic spectrum to IV vancomycin and Zosyn to cover for HCAP organisms and if chest x-ray follow-up in a.m. is negative for pneumonia, can downgrade antibiotics. Continue oxygen, bronchodilator nebulizations, IV steroids, Mucinex and Tessalon Perles for hacking cough. We will also place empirically on Tamiflu. 2. Possible acute diastolic CHF: Follow 2-D echo. Agree with a dose of IV Lasix 1. Follow clinically and radiology clear in a.m. Troponin 2 negative. 3. Stage IV chronic kidney disease versus AoCKD: Baseline creatinine is not known. Patient has congenital solitary kidney. As per son/HCPOA, patient did see a nephrologist Dr. [name] [place] [facility] years ago. Follow BMP in a.m. 4. Mild hyperkalemia: Secondary to problem #3. Agree with a dose of Kayexalate area to Lasix should help. Follow-up BMP in a.m. 5. Advanced dementia: Mental status probably at baseline. 6. A. fib: Controlled ventricular rate. Coagulopathic on Coumadin. Hold Coumadin for  now. 7. Essential hypertension: Controlled 8. Coumadin related coagulopathy: No reported bleeding. Follow INR daily. 9. DO NOT RESUSCITATE   Code Status: DO NOT RESUSCITATE-confirmed with son/HCPOA Sarah Short. They would like BiPAP if needed. Family Communication: Discussed with son's Sarah Short and Sarah Short at bedside. Disposition Plan: Continue management in stepdown unit for additional 24 hours.   Consultants:  None  Procedures:  None  Antibiotics:  IV Zosyn 1/31 >  IV vancomycin 1/31 >   Subjective: Patient pleasantly confused and unable to provide any significant history. Continues to have wet sounding hacking intermittent cough without dyspnea.  Objective: Filed Vitals:   09/20/14 0718 09/20/14 0800 09/20/14 0821 09/20/14 0944  BP:   113/66   Pulse:  60 62   Temp: 98 F (36.7 C)     TempSrc: Axillary     Resp:  29 20   Height:      Weight:      SpO2:  95% 92% 94%    Intake/Output Summary (Last 24 hours) at 09/20/14 1013 Last data filed at 09/20/14 1009  Gross per 24 hour  Intake     60 ml  Output      0 ml  Net     60 ml   Filed Weights   09/20/14 0248  Weight: 46.72 kg (103 lb)     Exam:  General exam: Small built and frail elderly female lying comfortably propped up in bed with no obvious distress. Respiratory system: Diminished breath sounds bilaterally with scattered bilateral few medium pitched expiratory rhonchi and occasional basal crackles. No increased work of breathing. Cardiovascular system: S1 & S2 heard, irregularly irregular. No murmurs, gallops, clicks.? JVD. 1+ bilateral ankle edema. Gastrointestinal system: Abdomen is nondistended, soft and nontender. Normal bowel sounds heard. Central nervous system: Alert and oriented only to  self. No focal neurological deficits. Extremities: Symmetric 5 x 5 power.   Data Reviewed: Basic Metabolic Panel:  Recent Labs Lab 09/20/14 0348 09/20/14 0409  NA 140 142  K 5.2* 5.2*  CL 99 98  CO2 38*  --    GLUCOSE 110* 110*  BUN 40* 42*  CREATININE 1.55* 1.50*  CALCIUM 9.1  --    Liver Function Tests:  Recent Labs Lab 09/20/14 0348  AST 49*  ALT 42*  ALKPHOS 70  BILITOT 0.8  PROT 5.9*  ALBUMIN 3.8   No results for input(s): LIPASE, AMYLASE in the last 168 hours. No results for input(s): AMMONIA in the last 168 hours. CBC:  Recent Labs Lab 09/20/14 0347 09/20/14 0409  WBC 5.7  --   NEUTROABS 4.0  --   HGB 13.3 15.3*  HCT 45.0 45.0  MCV 91.8  --   PLT 159  --    Cardiac Enzymes:  Recent Labs Lab 09/20/14 0703  TROPONINI 0.03   BNP (last 3 results) No results for input(s): PROBNP in the last 8760 hours. CBG: No results for input(s): GLUCAP in the last 168 hours.  No results found for this or any previous visit (from the past 240 hour(s)).        Studies: Dg Chest 2 View  09/20/2014   CLINICAL DATA:  Shortness of breath. Coughing since Wednesday or longer. Previous smoker.  EXAM: CHEST  2 VIEW  COMPARISON:  06/10/2014  FINDINGS: Cardiac enlargement without vascular congestion or edema. Small bilateral pleural effusions with basilar atelectasis. No focal consolidation in the lungs. No pneumothorax. Calcified and tortuous aorta. Old fracture deformity of the right humerus.  IMPRESSION: Cardiac enlargement. Small bilateral pleural effusions with basilar atelectasis.   Electronically Signed   By: Burman Nieves M.D.   On: 09/20/2014 03:40        Scheduled Meds: . amLODipine  10 mg Oral Daily  . benzonatate  200 mg Oral TID  . carvedilol  3.125 mg Oral BID WC  . donepezil  10 mg Oral BH-q7a  . escitalopram  10 mg Oral Daily  . feeding supplement (ENSURE COMPLETE)  237 mL Oral TID WC  . folic acid  1 mg Oral BH-q7a  . guaiFENesin  600 mg Oral BID  . ipratropium-albuterol  3 mL Nebulization Q6H  . methylPREDNISolone (SOLU-MEDROL) injection  60 mg Intravenous 3 times per day  . mirtazapine  15 mg Oral QHS  . oseltamivir  30 mg Oral Daily  . pantoprazole   40 mg Oral Daily  . piperacillin-tazobactam (ZOSYN)  IV  2.25 g Intravenous 4 times per day  . polyvinyl alcohol  1 drop Both Eyes TID  . pravastatin  80 mg Oral Daily  . protein supplement  2 scoop Oral TID WC  . sodium polystyrene  15 g Oral Once  . [START ON 09/22/2014] vancomycin  500 mg Intravenous Q48H  . vancomycin  1,000 mg Intravenous Once   Continuous Infusions:   Principal Problem:   COPD exacerbation Active Problems:   PVD (peripheral vascular disease)   Presenile dementia   Atrial fibrillation   HTN (hypertension)   HLD (hyperlipidemia)   Bilateral leg edema   Solitary kidney, congenital   AKI (acute kidney injury)    Time spent: 45 minutes.    Marcellus Scott, MD, FACP, FHM. [facility] Pager [phone]  If 7PM-7AM, please contact night-coverage [email or web address] Password [email or web address] 09/20/2014, 10:13 AM    LOS: 0 days

## 2014-09-20 NOTE — Progress Notes (Addendum)
ANTICOAGULATION CONSULT NOTE - Initial Consult  Pharmacy Consult for:  Warfarin Indication:  Atrial fibrillation  No Known Allergies  Patient Measurements: Height: 5\' 3"  (160 cm) Weight: 103 lb (46.72 kg) IBW/kg (Calculated) : 52.4   Vital Signs: Temp: 98.9 F (37.2 C) (01/31 0424) Temp Source: Rectal (01/31 0424) BP: 123/53 mmHg (01/31 0545) Pulse Rate: 63 (01/31 0430)  Labs:  Recent Labs  09/20/14 0347 09/20/14 0348 09/20/14 0409  HGB 13.3  --  15.3*  HCT 45.0  --  45.0  PLT 159  --   --   LABPROT 51.2*  --   --   INR 5.61*  --   --   CREATININE  --  1.55* 1.50*    Estimated Creatinine Clearance: 17.3 mL/min (by C-G formula based on Cr of 1.5).   Medical History: Past Medical History  Diagnosis Date  . Presenile dementia   . COPD (chronic obstructive pulmonary disease)   . Anxiety   . PVD (peripheral vascular disease)   . Atrial fibrillation     Medications:  Scheduled:  . amLODipine  10 mg Oral Daily  . [START ON 09/21/2014] azithromycin  250 mg Oral Daily  . carvedilol  3.125 mg Oral BID WC  . donepezil  10 mg Oral BH-q7a  . escitalopram  10 mg Oral Daily  . feeding supplement (ENSURE COMPLETE)  237 mL Oral TID WC  . folic acid  1 mg Oral BH-q7a  . ipratropium-albuterol  3 mL Nebulization Q4H  . methylPREDNISolone (SOLU-MEDROL) injection  60 mg Intravenous 3 times per day  . mirtazapine  15 mg Oral QHS  . pantoprazole  40 mg Oral Daily  . polyvinyl alcohol  1 drop Both Eyes TID  . pravastatin  80 mg Oral Daily  . protein supplement  2 scoop Oral TID WC  . sodium polystyrene  15 g Oral Once    Assessment:  Asked to assist with warfarin therapy for this 81 year-old female with atrial fibrillation.  The usual warfarin dose is documented as 3.5 mg daily with the last dose given on 09/19/14.  The INR is supratherapeutic on admission at 5.61.  Goals of Therapy:   PT/INR in the range 2-3  Prevention of systemic thromboembolism   Plan:    Follow PT/INR daily  Hold warfarin until the INR is within the therapeutic range  Goodyear TireClaire Garrit Marrow R.Ph. 09/20/2014,6:55 AM

## 2014-09-20 NOTE — ED Notes (Addendum)
Awake. Verbally responsive. Resp even and labored. Audible exp wheezing and congested nonproductive cough. Auscultated rhonci/rales in bil and throughout with sats at 99% with neb tx. ABC's intact. A. Fib with occ PVC's on monitor. IV infusing Magnesium 2g without difficulty. Pt has white patches on tongue. Family at bedside.

## 2014-09-20 NOTE — ED Notes (Signed)
[facility] presents with 81 [facility] with [facility];  45 minutes prior to [facility] arrival pt c/o SOB, pt asked staff to call [facility];  Hx of anxiety, COPD.  [facility] stated upon their arrival pt held head down and appeared more anxious with fidgeting than SOB.  Pt has nonproductive cough with rhonchi lung sounds.  CBG 157.  Pt 12 lead afib.  [facility] notes pinpoint pupils, asked staff was pt on pain medications/denies pain meds given.  Pt is on Coumadin for atrial fibrillation.

## 2014-09-21 ENCOUNTER — Inpatient Hospital Stay (HOSPITAL_COMMUNITY): Payer: Medicare Other

## 2014-09-21 DIAGNOSIS — N189 Chronic kidney disease, unspecified: Secondary | ICD-10-CM

## 2014-09-21 DIAGNOSIS — J189 Pneumonia, unspecified organism: Secondary | ICD-10-CM | POA: Diagnosis not present

## 2014-09-21 DIAGNOSIS — I5043 Acute on chronic combined systolic (congestive) and diastolic (congestive) heart failure: Secondary | ICD-10-CM | POA: Insufficient documentation

## 2014-09-21 DIAGNOSIS — J9601 Acute respiratory failure with hypoxia: Secondary | ICD-10-CM

## 2014-09-21 DIAGNOSIS — N179 Acute kidney failure, unspecified: Secondary | ICD-10-CM | POA: Insufficient documentation

## 2014-09-21 LAB — BASIC METABOLIC PANEL
Anion gap: 5 (ref 5–15)
BUN: 52 mg/dL — ABNORMAL HIGH (ref 6–23)
CO2: 36 mmol/L — AB (ref 19–32)
CREATININE: 1.94 mg/dL — AB (ref 0.50–1.10)
Calcium: 8 mg/dL — ABNORMAL LOW (ref 8.4–10.5)
Chloride: 98 mmol/L (ref 96–112)
GFR calc non Af Amer: 21 mL/min — ABNORMAL LOW (ref >90)
GFR, EST AFRICAN AMERICAN: 24 mL/min — AB (ref >90)
Glucose, Bld: 166 mg/dL — ABNORMAL HIGH (ref 70–99)
Potassium: 4.8 mmol/L (ref 3.5–5.1)
Sodium: 139 mmol/L (ref 135–145)

## 2014-09-21 LAB — CBC WITH DIFFERENTIAL/PLATELET
BASOS PCT: 0 % (ref 0–1)
Basophils Absolute: 0 10*3/uL (ref 0.0–0.1)
Eosinophils Absolute: 0 10*3/uL (ref 0.0–0.7)
Eosinophils Relative: 0 % (ref 0–5)
HCT: 42.9 % (ref 36.0–46.0)
Hemoglobin: 12.6 g/dL (ref 12.0–15.0)
LYMPHS ABS: 0.3 10*3/uL — AB (ref 0.7–4.0)
LYMPHS PCT: 5 % — AB (ref 12–46)
MCH: 27.2 pg (ref 26.0–34.0)
MCHC: 29.4 g/dL — ABNORMAL LOW (ref 30.0–36.0)
MCV: 92.5 fL (ref 78.0–100.0)
MONO ABS: 0.1 10*3/uL (ref 0.1–1.0)
MONOS PCT: 2 % — AB (ref 3–12)
NEUTROS ABS: 4.6 10*3/uL (ref 1.7–7.7)
Neutrophils Relative %: 93 % — ABNORMAL HIGH (ref 43–77)
PLATELETS: 142 10*3/uL — AB (ref 150–400)
RBC: 4.64 MIL/uL (ref 3.87–5.11)
RDW: 17.7 % — ABNORMAL HIGH (ref 11.5–15.5)
WBC: 5 10*3/uL (ref 4.0–10.5)

## 2014-09-21 LAB — LEGIONELLA ANTIGEN, URINE

## 2014-09-21 LAB — LIPID PANEL
Cholesterol: 123 mg/dL (ref 0–200)
HDL: 52 mg/dL (ref >39)
LDL CALC: 63 mg/dL (ref 0–99)
TRIGLYCERIDES: 42 mg/dL (ref <150)
Total CHOL/HDL Ratio: 2.4 RATIO
VLDL: 8 mg/dL (ref 0–40)

## 2014-09-21 LAB — PROTIME-INR
INR: 7.79 (ref 0.00–1.49)
Prothrombin Time: 66.1 seconds — ABNORMAL HIGH (ref 11.6–15.2)

## 2014-09-21 MED ORDER — HALOPERIDOL LACTATE 5 MG/ML IJ SOLN
1.0000 mg | INTRAMUSCULAR | Status: DC | PRN
  Administered 2014-09-21: 4 mg via INTRAVENOUS
  Filled 2014-09-21: qty 1

## 2014-09-21 MED ORDER — FUROSEMIDE 10 MG/ML IJ SOLN
20.0000 mg | Freq: Once | INTRAMUSCULAR | Status: AC
Start: 2014-09-21 — End: 2014-09-21
  Administered 2014-09-21: 20 mg via INTRAVENOUS
  Filled 2014-09-21: qty 2

## 2014-09-21 MED ORDER — HYDRALAZINE HCL 20 MG/ML IJ SOLN: 10.0000 mg | Freq: Four times a day (QID) | INTRAMUSCULAR | Status: DC | PRN

## 2014-09-21 MED ORDER — HALOPERIDOL LACTATE 5 MG/ML IJ SOLN
1.0000 mg | Freq: Four times a day (QID) | INTRAMUSCULAR | Status: DC | PRN
Start: 2014-09-21 — End: 2014-09-22
  Administered 2014-09-21: 4 mg via INTRAVENOUS
  Filled 2014-09-21: qty 1

## 2014-09-21 MED ORDER — METHYLPREDNISOLONE SODIUM SUCC 40 MG IJ SOLR
40.0000 mg | Freq: Two times a day (BID) | INTRAMUSCULAR | Status: DC
  Administered 2014-09-21 – 2014-09-22 (×2): 40 mg via INTRAVENOUS
  Filled 2014-09-21 (×2): qty 1

## 2014-09-21 MED ORDER — PHYTONADIONE 5 MG PO TABS
2.5000 mg | ORAL_TABLET | Freq: Once | ORAL | Status: AC
  Administered 2014-09-21: 2.5 mg via ORAL
  Filled 2014-09-21: qty 1

## 2014-09-21 MED ORDER — FUROSEMIDE 10 MG/ML IJ SOLN
20.0000 mg | Freq: Once | INTRAMUSCULAR | Status: AC
  Administered 2014-09-21: 20 mg via INTRAVENOUS
  Filled 2014-09-21: qty 2

## 2014-09-21 MED ORDER — SODIUM CHLORIDE 0.9 % IV SOLN
INTRAVENOUS | Status: DC
  Administered 2014-09-21: via INTRAVENOUS

## 2014-09-21 NOTE — Progress Notes (Signed)
Patient unable to do flutter valve, very weak but has a strong congested cough. Will get an order for PRN NTS.

## 2014-09-21 NOTE — Progress Notes (Signed)
PT Cancellation Note  Patient Details Name: Sarah Short MRN: [id number] DOB: 1919-09-29   Cancelled Treatment:    Reason Eval/Treat Not Completed: Patient not medically ready (increased HR, on Venturi)   Rada HayHill, Shanin Szymanowski Elizabeth 09/21/2014, 12:41 PM Blanchard KelchKaren Abdulahad Mederos PT [phone]

## 2014-09-21 NOTE — Progress Notes (Signed)
Patient suctioned for a moderate amount of thick white/pink tinged secretions. o2 sat. Increased to 100% post NTS. Vital signs remained stable.

## 2014-09-21 NOTE — Progress Notes (Signed)
Patient suctioned for a moderate amount of thick white secretions. Vital signs remained stable. O2 sat 100% on 40% venti mask. Patient felt better post suctioning.

## 2014-09-21 NOTE — Progress Notes (Signed)
OT Cancellation Note  Patient Details Name: Nydia BoutonSara V Esses MRN: [id number] DOB: [birth year]   Cancelled Treatment:    Reason Eval/Treat Not Completed: Medical issues which prohibited therapy. Note medical issues (increased HR, venturi mask, increased INR). Will hold on eval.  Lennox LaityStone, Camerin Jimenez Stafford  [phone] 09/21/2014, 1:08 PM

## 2014-09-21 NOTE — Progress Notes (Signed)
eLink Physician-Brief Progress Note Patient Name: Nydia BoutonSara V Goll DOB: [birth year] MRN: [id number]   Date of Service  09/21/2014  HPI/Events of Note  Agitated delerium - oncamera H/o dementia  eICU Interventions  Haldol prn     Intervention Category Major Interventions: Delirium, psychosis, severe agitation - evaluation and management  ALVA,RAKESH V. 09/21/2014, 3:27 AM

## 2014-09-21 NOTE — Progress Notes (Addendum)
ANTICOAGULATION CONSULT NOTE  Pharmacy Consult for:  Warfarin Indication:  Atrial fibrillation  No Known Allergies  Patient Measurements: Height:  (160 cm) Weight: 103 lb (46.72 kg) IBW/kg (Calculated) : 52.4   Vital Signs: Temp: 98.2 F (36.8 C) (02/01 0400) Temp Source: Oral (02/01 0400) BP: 109/47 mmHg (02/01 0600) Pulse Rate: 52 (02/01 0600)  Labs:  Recent Labs  09/20/14 0347  09/20/14 0409 09/20/14 0703 09/20/14 1320 09/20/14 1657 09/20/14 1837 09/21/14 0419  HGB 13.3  --  15.3*  --   --   --   --  12.6  HCT 45.0  --  45.0  --   --   --   --  42.9  PLT 159  --   --   --   --   --   --  142*  LABPROT 51.2*  --   --   --   --   --   --  66.1*  INR 5.61*  --   --   --   --   --   --  7.79*  CREATININE  --   < > 1.50*  --   --  1.73*  --  1.94*  TROPONINI  --   --   --  0.03 0.03  --  0.03  --   < > = values in this interval not displayed.  Estimated Creatinine Clearance: 13.4 mL/min (by C-G formula based on Cr of 1.94).   Medical History: Past Medical History  Diagnosis Date  . Presenile dementia   . COPD (chronic obstructive pulmonary disease)   . Anxiety   . PVD (peripheral vascular disease)   . Atrial fibrillation     Medications:  Scheduled:  . amLODipine  10 mg Oral Daily  . antiseptic oral rinse  7 mL Mouth Rinse BID  . benzonatate  200 mg Oral TID  . carvedilol  3.125 mg Oral BID WC  . donepezil  10 mg Oral BH-q7a  . escitalopram  10 mg Oral Daily  . feeding supplement (ENSURE COMPLETE)  237 mL Oral TID WC  . folic acid  1 mg Oral BH-q7a  . guaiFENesin  600 mg Oral BID  . ipratropium-albuterol  3 mL Nebulization Q6H  . methylPREDNISolone (SOLU-MEDROL) injection  40 mg Intravenous 3 times per day  . mirtazapine  15 mg Oral QHS  . oseltamivir  30 mg Oral Daily  . pantoprazole  40 mg Oral Daily  . piperacillin-tazobactam (ZOSYN)  IV  2.25 g Intravenous 4 times per day  . pneumococcal 23 valent vaccine  0.5 mL Intramuscular Tomorrow-1000   . polyvinyl alcohol  1 drop Both Eyes TID  . pravastatin  80 mg Oral Daily  . protein supplement  2 scoop Oral TID WC  . [START ON 09/22/2014] vancomycin  500 mg Intravenous Q48H    Assessment: 89yoF with atrial fibrillation on warfarin admitted for cough/SOB, bilateral LE edema.  The usual warfarin dose is documented as 3.5 mg daily with the last dose given on 09/19/14.  INR is supratherapeutic on admission at 5.61; may be due to recent illness; no interacting home meds noted  Today, 09/21/2014:  INR continues to rise despite holding warfarin, likely related to ongoing illness, poor PO intake, and now broad spectrum abx  CBC w/ dilutional effects, Hgb still wnl but plt slightly low  No bleeding reported per nursing, some delirium with agitated thrashing overnight but no injuries noted  Eating 50% of meals  Goals of  Therapy:   PT/INR in the range 2-3  Prevention of systemic thromboembolism   Plan:   Follow PT/INR daily  Continue to hold warfarin until the INR is within the therapeutic range  Note MD plans to give Vit K 2.5 mg PO x 1 today  Bernadene Personrew Nashid Pellum, PharmD Pager: [phone] 09/21/2014, 7:09 AM

## 2014-09-21 NOTE — Progress Notes (Signed)
CARE MANAGEMENT NOTE 09/21/2014  Patient:  Nydia BoutonCOLLINS,Cordelia V   Account Number:  [id number]  Date Initiated:  09/21/2014  Documentation initiated by:  DAVIS,RHONDA  Subjective/Objective Assessment:   confusion, desats, o2 at 50%fi02 venturi mask-vent am of [id number]     Action/Plan:   tbd   Anticipated DC Date:  09/24/2014   Anticipated DC Plan:    In-house referral  Clinical Social Worker      DC Planning Services  CM consult      [facility]   Choice offered to / List presented to:  NA   DME arranged  NA      DME agency  NA     HH arranged  NA      HH agency  NA   Status of service:  In process, will continue to follow Medicare Important Message given?   (If response is "NO", the following Medicare IM given date fields will be blank) Date Medicare IM given:   Medicare IM given by:   Date Additional Medicare IM given:   Additional Medicare IM given by:    Discharge Disposition:    Per UR Regulation:  Reviewed for med. necessity/level of care/duration of stay  If discussed at Long Length of Stay Meetings, dates discussed:    Comments:  [id number]/Rhonda Davis,RN,BSN,CCM

## 2014-09-21 NOTE — Progress Notes (Signed)
PT slept soundly for over 3 hrs sats in mid nineties. She became agitated moving around bed thrashing. She would not answer questions or respond to her name. Possibly not awake and certainly confused. Unable to wake pt and unable to calm, so contacted [facility] orders rec. Respiratory and I at bedside attempting to keep mask on pt. Pt able to calm and go back to sleep.

## 2014-09-21 NOTE — Clinical Documentation Improvement (Signed)
Pt. clinical picture exhibits symptoms or diagnostic findings potentially associated with Behavioral disturbances  Please indicate if dementia associated with:     Acute confusional state/delirium     Behavioral disturbance        Aggressive behavior        Combative behavior        Violent behavior        Depressive features  Other Not able to determine  Sign & Symptoms: 09/21/14 eLink Physician documented:Delerium, psychosis, severe agitation Nursing staff and RTT at bedside to keep O2 mask on pt. until calm  Treatment: Haldol IV, prn q 6hr  Thank you,  Andy GaussGarnet C Hartman Minahan ,RN Clinical Documentation Specialist:  [phone]  [facility]

## 2014-09-21 NOTE — Progress Notes (Signed)
CSW reviewed chart and noted that [facility].  Per chart review, pt currently on venturi mask.  CSW to continue to follow pt progress and assist with pt disposition needs and complete full psychosocial assessment when appropriate.  Loletta SpecterSuzanna Kidd, MSW, LCSW Clinical Social Work [phone]

## 2014-09-21 NOTE — Progress Notes (Signed)
PROGRESS NOTE    Sarah Short AVW:[id number] DOB: 05/01/1920 DOA: 09/20/2014 PCP: Florentina JennyRIPP, HENRY, MD  HPI/Brief narrative 81 year old female, ALF resident, not on home oxygen, former heavy smoker, COPD, advanced dementia, A. fib on Coumadin, HTN, HLD, congenital solitary kidney, possible chronic kidney disease, ambulates with the help of a walker, presented with 1 week off mostly nonproductive cough, progressive dyspnea, wheezing and bilateral leg edema. No reported fevers or swallowing difficulties. In the ED, creatinine 1.5, potassium 5.2, troponin 1 negative, INR 5.6 mild transaminitis, chest x-ray showed cardiomegaly, small bilateral pleural effusions with bibasal atelectasis. Admitted for possible COPD/CHF exacerbation.    Assessment/Plan:  1. Acute bronchitis/possible LLL HCAP with COPD exacerbation: Likely precipitated by viral URI.  Urine Legionella and streptococcal antigen negative. RSV panel-pending. Continue IV vancomycin and Zosyn. Continue oxygen, bronchodilator nebulizations, IV steroids, Mucinex and Tessalon Perles for hacking cough. Continue empiric Tamiflu. Breathing seemed a little better this a.m. but since then has been tenuous. 2. Possible acute diastolic and systolic CHF: 2-D echo: LVEF 55-60 percent and possible hypokinesis of the basal inferior and inferoseptal myocardium. Troponin 3 negative. Treated with low-dose IV Lasix but creatinine increasing. Difficult situation. Chest x-ray suggests pulmonary edema and possible left lower lobe pneumonia. 3. Acute hypoxic respiratory failure: Secondary to pneumonia, COPD exacerbation and decompensated CHF. Treat underlying cause. Continue oxygen. 4. Acute on stage IV chronic kidney disease: Baseline creatinine is not known. Patient has congenital solitary kidney. As per son/HCPOA, patient did see a nephrologist Dr. [name] in [facility] years ago. Renal ultrasound without hydronephrosis. Creatinine increasing. Follow BMP in  a.m. 5. Mild hyperkalemia: Secondary to problem #3. Agree with a dose of Kayexalate area to Lasix should help. Resolved 6. Advanced dementia with behavioral disturbance: Patient had agitation overnight likely secondary to acute illness and sundowning. Better with when necessary IV Haldol. 7. A. fib: Controlled ventricular rate. Coagulopathic on Coumadin. Hold Coumadin for now. 8. Essential hypertension: Uncontrolled. When necessary IV hydralazine 9. Coumadin related coagulopathy: No reported bleeding. INR greater than 7. We'll provide her dose of vitamin K and follow in a.m. 10. Acute urinary retention: Place Foley 11. DO NOT RESUSCITATE   Code Status: DO NOT RESUSCITATE-confirmed with son/HCPOA [name]. They would like BiPAP if needed. Family Communication: Discussed with patient's youngest son at bedside. Outlined critically ill condition. Recommend monitor additional day and consider transitioning to more comfort oriented care if does not improve. Disposition Plan: Continue management in stepdown unit for additional 24 hours.   Consultants:  None  Procedures:  None  Antibiotics:  IV Zosyn 1/31 >  IV vancomycin 1/31 >   Subjective: Overnight events noted. Somnolent but easily arousable this morning. As per nursing, intermittent worsening dyspnea despite saturations in the 90s.  Objective: Filed Vitals:   09/21/14 1400 09/21/14 1406 09/21/14 1520 09/21/14 1600  BP: 147/132  153/105 98/66  Pulse: 65  58 65  Temp: 98.8 F (37.1 C)  99.3 F (37.4 C) 99.3 F (37.4 C)  TempSrc:      Resp: 17  19 29   Height:      Weight:      SpO2: 87% 94% 97% 95%    Intake/Output Summary (Last 24 hours) at 09/21/14 1658 Last data filed at 09/21/14 1440  Gross per 24 hour  Intake    690 ml  Output    685 ml  Net      5 ml   Filed Weights   09/20/14 0248  Weight: 46.72 kg (103 lb)  Exam:  General exam: Small built and frail elderly female lying comfortably propped up in bed  with no obvious distress. Respiratory system: Improved breath sounds bilaterally this morning compared to yesterday. Slightly harsh bilaterally with occasional rhonchi and bibasal crackles. No increased work of breathing. Cardiovascular system: S1 & S2 heard, irregularly irregular. No murmurs, gallops, clicks. No JVD or ankle edema. Gastrointestinal system: Abdomen is nondistended, soft and nontender. Normal bowel sounds heard. Central nervous system: Somnolent, easily arousable and oriented only to self. No focal neurological deficits. Extremities: Symmetric 5 x 5 power.   Data Reviewed: Basic Metabolic Panel:  Recent Labs Lab 09/20/14 0348 09/20/14 0409 09/20/14 1657 09/21/14 0419  NA 140 142 144 139  K 5.2* 5.2* 5.1 4.8  CL 99 98 100 98  CO2 38*  --  39* 36*  GLUCOSE 110* 110* 171* 166*  BUN 40* 42* 44* 52*  CREATININE 1.55* 1.50* 1.73* 1.94*  CALCIUM 9.1  --  8.5 8.0*   Liver Function Tests:  Recent Labs Lab 09/20/14 0348  AST 49*  ALT 42*  ALKPHOS 70  BILITOT 0.8  PROT 5.9*  ALBUMIN 3.8   No results for input(s): LIPASE, AMYLASE in the last 168 hours. No results for input(s): AMMONIA in the last 168 hours. CBC:  Recent Labs Lab 09/20/14 0347 09/20/14 0409 09/21/14 0419  WBC 5.7  --  5.0  NEUTROABS 4.0  --  4.6  HGB 13.3 15.3* 12.6  HCT 45.0 45.0 42.9  MCV 91.8  --  92.5  PLT 159  --  142*   Cardiac Enzymes:  Recent Labs Lab 09/20/14 0703 09/20/14 1320 09/20/14 1837  TROPONINI 0.03 0.03 0.03   BNP (last 3 results) No results for input(s): PROBNP in the last 8760 hours. CBG: No results for input(s): GLUCAP in the last 168 hours.  Recent Results (from the past 240 hour(s))  Culture, blood (routine x 2)     Status: None (Preliminary result)   Collection Time: 09/20/14  4:00 AM  Result Value Ref Range Status   Specimen Description BLOOD LEFT HAND  Final   Special Requests BOTTLES DRAWN AEROBIC AND ANAEROBIC  Final   Culture   Final            BLOOD CULTURE RECEIVED NO GROWTH TO DATE CULTURE WILL BE HELD FOR 5 DAYS BEFORE ISSUING A FINAL NEGATIVE REPORT Performed at [facility]    Report Status PENDING  Incomplete  Culture, blood (routine x 2)     Status: None (Preliminary result)   Collection Time: 09/20/14  4:05 AM  Result Value Ref Range Status   Specimen Description BLOOD LEFT SIDE  Final   Special Requests BOTTLES DRAWN AEROBIC AND ANAEROBIC  Final   Culture   Final           BLOOD CULTURE RECEIVED NO GROWTH TO DATE CULTURE WILL BE HELD FOR 5 DAYS BEFORE ISSUING A FINAL NEGATIVE REPORT Performed at [facility]    Report Status PENDING  Incomplete  MRSA PCR Screening     Status: None   Collection Time: 09/20/14  6:40 AM  Result Value Ref Range Status   MRSA by PCR NEGATIVE NEGATIVE Final    Comment:        The GeneXpert MRSA Assay (FDA approved for NASAL specimens only), is one component of a comprehensive MRSA colonization surveillance program. It is not intended to diagnose MRSA infection nor to guide or monitor treatment for MRSA infections.  Studies: Dg Chest 2 View  09/20/2014   CLINICAL DATA:  Shortness of breath. Coughing since Wednesday or longer. Previous smoker.  EXAM: CHEST  2 VIEW  COMPARISON:  06/10/2014  FINDINGS: Cardiac enlargement without vascular congestion or edema. Small bilateral pleural effusions with basilar atelectasis. No focal consolidation in the lungs. No pneumothorax. Calcified and tortuous aorta. Old fracture deformity of the right humerus.  IMPRESSION: Cardiac enlargement. Small bilateral pleural effusions with basilar atelectasis.   Electronically Signed   By: Burman Nieves M.D.   On: 09/20/2014 03:40   US Renal Port  09/20/2014   CLINICAL DATA:  Acute renal injury  EXAM: RENAL/URINARY TRACT ULTRASOUND COMPLETE  COMPARISON:  None.  FINDINGS: Right Kidney:  Length: 10.6 cm. Isoechoic to the adjacent liver. No mass or hydronephrosis visualized.   Left Kidney:  Length: 6.9 cm. Echogenic parenchyma. 8 x 6 x 9 mm hypoechoic lesion with low-level internal echoes in the upper pole. No hydronephrosis.  Bladder:  Appears normal for degree of bladder distention.  Bilateral pleural effusions noted.  IMPRESSION: 1. Negative for hydronephrosis. 2. Small atrophic echogenic left kidney with 9mm complex upper pole cystic lesion. 3. Bilateral pleural effusions   Electronically Signed   By: Oley Balm M.D.   On: 09/20/2014 11:15   Dg Chest Port 1 View  09/21/2014   CLINICAL DATA:  Pneumonia.  COPD.  EXAM: PORTABLE CHEST - 1 VIEW  COMPARISON:  09/20/2014.  FINDINGS: Mediastinum and hilar structures are normal. Cardiomegaly with diffuse bilateral interstitial prominence. Left lower lobe atelectasis and/or infiltrate. Small right pleural effusion noted. Left costophrenic angle not imaged. No pneumothorax. No acute osseous abnormality.  IMPRESSION: 1. Cardiomegaly with bilateral pulmonary interstitial prominence and right-sided small pleural effusion. These findings suggest congestive heart failure. 2. Left lower lobe atelectasis and/or infiltrate.   Electronically Signed   By: Maisie Fus  Register   On: 09/21/2014 07:10        Scheduled Meds: . amLODipine  10 mg Oral Daily  . antiseptic oral rinse  7 mL Mouth Rinse BID  . benzonatate  200 mg Oral TID  . carvedilol  3.125 mg Oral BID WC  . donepezil  10 mg Oral BH-q7a  . escitalopram  10 mg Oral Daily  . feeding supplement (ENSURE COMPLETE)  237 mL Oral TID WC  . folic acid  1 mg Oral BH-q7a  . furosemide  20 mg Intravenous Once  . guaiFENesin  600 mg Oral BID  . ipratropium-albuterol  3 mL Nebulization Q6H  . methylPREDNISolone (SOLU-MEDROL) injection  40 mg Intravenous Q12H  . mirtazapine  15 mg Oral QHS  . oseltamivir  30 mg Oral Daily  . pantoprazole  40 mg Oral Daily  . piperacillin-tazobactam (ZOSYN)  IV  2.25 g Intravenous 4 times per day  . polyvinyl alcohol  1 drop Both Eyes TID  .  pravastatin  80 mg Oral Daily  . protein supplement  2 scoop Oral TID WC  . [START ON 09/22/2014] vancomycin  500 mg Intravenous Q48H   Continuous Infusions:   Principal Problem:   COPD exacerbation Active Problems:   PVD (peripheral vascular disease)   Presenile dementia   Atrial fibrillation   HTN (hypertension)   HLD (hyperlipidemia)   Bilateral leg edema   Solitary kidney, congenital   AKI (acute kidney injury)    Time spent: 45 minutes.    Marcellus Scott, MD, FACP, FHM. [facility] Pager [phone]  If 7PM-7AM, please contact night-coverage [email or web address] Password [facility] 09/21/2014, 4:58 PM  LOS: 1 day

## 2014-09-22 DIAGNOSIS — J189 Pneumonia, unspecified organism: Principal | ICD-10-CM

## 2014-09-22 LAB — RESPIRATORY VIRUS PANEL
ADENOVIRUS: NEGATIVE
INFLUENZA A: NEGATIVE
INFLUENZA B 1: NEGATIVE
Metapneumovirus: NEGATIVE
Parainfluenza 1: NEGATIVE
Parainfluenza 2: NEGATIVE
Parainfluenza 3: NEGATIVE
RESPIRATORY SYNCYTIAL VIRUS A: NEGATIVE
RESPIRATORY SYNCYTIAL VIRUS B: NEGATIVE
RHINOVIRUS: NEGATIVE

## 2014-09-22 LAB — CBC
HCT: 40.6 % (ref 36.0–46.0)
HEMOGLOBIN: 12.2 g/dL (ref 12.0–15.0)
MCH: 26.9 pg (ref 26.0–34.0)
MCHC: 30 g/dL (ref 30.0–36.0)
MCV: 89.6 fL (ref 78.0–100.0)
PLATELETS: 134 10*3/uL — AB (ref 150–400)
RBC: 4.53 MIL/uL (ref 3.87–5.11)
RDW: 17.8 % — AB (ref 11.5–15.5)
WBC: 7.4 10*3/uL (ref 4.0–10.5)

## 2014-09-22 LAB — PROTIME-INR
INR: 3.32 — AB (ref 0.00–1.49)
Prothrombin Time: 34 seconds — ABNORMAL HIGH (ref 11.6–15.2)

## 2014-09-22 LAB — HIV ANTIBODY (ROUTINE TESTING W REFLEX): HIV SCREEN 4TH GENERATION: NONREACTIVE

## 2014-09-22 LAB — BASIC METABOLIC PANEL
Anion gap: 11 (ref 5–15)
BUN: 51 mg/dL — AB (ref 6–23)
CHLORIDE: 96 mmol/L (ref 96–112)
CO2: 36 mmol/L — ABNORMAL HIGH (ref 19–32)
CREATININE: 1.95 mg/dL — AB (ref 0.50–1.10)
Calcium: 7.7 mg/dL — ABNORMAL LOW (ref 8.4–10.5)
GFR, EST AFRICAN AMERICAN: 24 mL/min — AB (ref >90)
GFR, EST NON AFRICAN AMERICAN: 21 mL/min — AB (ref >90)
GLUCOSE: 124 mg/dL — AB (ref 70–99)
Potassium: 4 mmol/L (ref 3.5–5.1)
Sodium: 143 mmol/L (ref 135–145)

## 2014-09-22 MED ORDER — ACETAMINOPHEN 500 MG PO TABS: 500.0000 mg | ORAL_TABLET | Freq: Four times a day (QID) | ORAL | Status: DC | PRN

## 2014-09-22 MED ORDER — BENZONATATE 200 MG PO CAPS: 200.0000 mg | ORAL_CAPSULE | Freq: Three times a day (TID) | ORAL | Status: AC

## 2014-09-22 MED ORDER — ENSURE COMPLETE PO LIQD: 237.0000 mL | Freq: Three times a day (TID) | ORAL | Status: AC

## 2014-09-22 MED ORDER — BENZONATATE 200 MG PO CAPS: 200.0000 mg | ORAL_CAPSULE | Freq: Three times a day (TID) | ORAL | Status: DC

## 2014-09-22 MED ORDER — LORAZEPAM 2 MG/ML PO CONC: 1.0000 mg | ORAL | Status: AC | PRN

## 2014-09-22 MED ORDER — MORPHINE SULFATE (CONCENTRATE) 10 MG/0.5ML PO SOLN
10.0000 mg | ORAL | Status: DC | PRN
Start: 2014-09-22 — End: 2014-09-22

## 2014-09-22 MED ORDER — LORAZEPAM 2 MG/ML PO CONC: 1.0000 mg | ORAL | Status: DC | PRN

## 2014-09-22 MED ORDER — RESOURCE INSTANT PROTEIN PO PWD PACKET: 2.0000 | Freq: Three times a day (TID) | ORAL | Status: AC

## 2014-09-22 MED ORDER — MORPHINE SULFATE (CONCENTRATE) 10 MG/0.5ML PO SOLN: 10.0000 mg | ORAL | Status: DC | PRN

## 2014-09-22 MED ORDER — MORPHINE SULFATE (CONCENTRATE) 10 MG/0.5ML PO SOLN: 10.0000 mg | ORAL | Status: AC | PRN

## 2014-09-22 MED ORDER — ACETAMINOPHEN 500 MG PO TABS: 500.0000 mg | ORAL_TABLET | Freq: Four times a day (QID) | ORAL | Status: AC | PRN

## 2014-09-22 NOTE — Progress Notes (Signed)
OT Cancellation Note  Patient Details Name: Sarah Short MRN: [id number] DOB: 09-29-1919   Cancelled Treatment:    Reason Eval/Treat Not Completed: Other (comment).  Noted pt is for residential hospice/comfort care.  Will sign off.  Persephonie Hegwood 09/22/2014, 11:45 AM  Marica OtterMaryellen Joyce Heitman, OTR/L [phone] 09/22/2014

## 2014-09-22 NOTE — Progress Notes (Signed)
PT Cancellation Note  Patient Details Name: Nydia BoutonSara V Wander MRN: [id number] DOB: 08-08-1920   Cancelled Treatment:    Reason Eval/Treat Not Completed: Medical issues which prohibited therapy (continues on Venti mask, chart reviewed.  check back 2/3 for  plan/)   Rada HayHill, Raiyan Dalesandro Elizabeth 09/22/2014, 7:51 AM Blanchard KelchKaren Leylany Nored PT [phone]

## 2014-09-22 NOTE — Progress Notes (Signed)
CSW continuing to follow.  CSW received notification from [facility] liaison, Jacklynn GanongDiana Stephens that pt has been accepted to hospice home and bed available today.   CSW facilitated pt discharge needs including notifying facility that discharge summary available, discussing with pt family at bedside, confirming RN called report, and appreciate unit secretary assistance with arranging PTAR transport to [facility].  Pt family appreciative of CSW support and assistance.  No further social work needs identified at this time.  CSW signing off.   Loletta SpecterSuzanna Kidd, MSW, LCSW Clinical Social Work [phone]

## 2014-09-22 NOTE — Progress Notes (Signed)
Clinical Social Work Department BRIEF PSYCHOSOCIAL ASSESSMENT 09/22/2014  Patient:  Sarah Short, Sarah Short     Account Number:  [id number]     Admit date:  09/20/2014  Clinical Social Worker:  Maryln Manuel  Date/Time:  09/22/2014 10:00 AM  Referred by:  Physician  Date Referred:  09/22/2014 Referred for  Residential hospice placement   Other Referral:   Interview type:  Family Other interview type:    PSYCHOSOCIAL DATA Living Status:  FACILITY Admitted from facility:  [facility] Level of care:  Assisted Living Primary support name:  Fritz Pickerel Huettner/son/[phone] Primary support relationship to patient:  CHILD, ADULT Degree of support available:   strong    CURRENT CONCERNS Current Concerns  Post-Acute Placement   Other Concerns:    SOCIAL WORK ASSESSMENT / PLAN CSW received notification from RN that pt family is agreeable to comfort care and MD requesting CSW begin working on disposition either pt returning to [employer] with ALF vs. Residential Hospice.    CSW met with pt son, Fritz Pickerel at bedside. CSW introduced self and explained role. Pt son confirmed that pt family focusing on comfort at this time. CSW discussed disposition options including returning to ALF with hospice vs. Residential hospice facility. Pt son does not feel that pt ALF would be able to manage pt needs and agreeable to residential hospice placement. CSW offered choice of residential hospice facilities. Pt son chooses [facility]. CSW discussed process of residential hospice process. Pt son expressed understanding.    CSW contacted [facility] liaison, Orbie Pyo. CSW  made referral [facility]. Per Orbie Pyo, facility has beds and liaison will process referral and notify this CSW if pt can transition to [place] today.    CSW updated MD, RN, and pt son, Fritz Pickerel at bedside.    CSW to await return phone call from [facility], Orbie Pyo regarding possible transition to residential hospice today.   Assessment/plan status:  Psychosocial Support/Ongoing Assessment of Needs Other assessment/ plan:   discharge planning   Information/referral to community resources:   Residential Hospice List    PATIENT'S/FAMILY'S RESPONSE TO PLAN OF CARE: Pt oriented to person and place only. Pt son supportive and actively involved in pt care. Pt son feels that pt needs will best  met at residential hospice facility. Pt son wants [facility] for end of life care.   Alison Murray, MSW, [place] Work [phone]

## 2014-09-22 NOTE — Discharge Summary (Signed)
Physician Discharge Summary  Sarah Short QJJ:[id number] DOB: Jul 27, 1920 DOA: 09/20/2014  PCP: Reymundo Poll, MD  Admit date: 09/20/2014 Discharge date: 09/22/2014  Time spent: Greater than 30 minutes  Recommendations for Outpatient Follow-up:  1. Patient transferring to residential hospice.  Discharge Diagnoses:  Principal Problem:   COPD exacerbation Active Problems:   PVD (peripheral vascular disease)   Presenile dementia   Atrial fibrillation   HTN (hypertension)   HLD (hyperlipidemia)   Bilateral leg edema   Solitary kidney, congenital   AKI (acute kidney injury)   Acute respiratory failure with hypoxia   Acute on chronic combined systolic and diastolic congestive heart failure   Renal failure, acute on chronic   Discharge Condition: Improved & Stable  Diet recommendation: Comfort feeds/diet of choice.  Filed Weights   09/20/14 0248 09/22/14 0400  Weight: 46.72 kg (103 lb) 50.8 kg (111 lb 15.9 oz)    History of present illness:  81 year old female, ALF resident, not on home oxygen, former heavy smoker, COPD, advanced dementia, A. fib on Coumadin, HTN, HLD, congenital solitary kidney, possible chronic kidney disease, ambulates with the help of a walker, presented with 1 week off mostly nonproductive cough, progressive dyspnea, wheezing and bilateral leg edema. No reported fevers or swallowing difficulties. In the ED, creatinine 1.5, potassium 5.2, troponin 1 negative, INR 5.6 mild transaminitis, chest x-ray showed cardiomegaly, small bilateral pleural effusions with bibasal atelectasis. Admitted for possible COPD/CHF exacerbation.  Hospital Course:   Patient was admitted to the stepdown unit. She was placed on broad-spectrum IV antibiotics-vancomycin and Zosyn for acute bronchitis and possible left lower lobe healthcare associated pneumonia complicating COPD exacerbation. She was treated empirically with Tamiflu. Her chest x-ray were also suggestive of CHF. She was  diuresed with when necessary IV Lasix. 2-D echo showed LVEF 55-60 percent with possible hypokinesis of the basal inferior and inferoseptal myocardium. Troponin 3 negative. Creatinine started to rise in patient with possible chronic kidney disease and solitary kidney. She has acute hypoxic respiratory failure secondary to acute bronchitis, pneumonia, COPD and decompensated CHF. She developed acute on stage IV chronic kidney disease, secondary to diuresis. She was mildly hyperkalemic on admission which resolved after a dose of Kayexalate and with diuresis. She has advanced dementia and had intermittent agitation and delirium requiring when necessary IV Haldol with good effect. Her atrial fibrillation was rate controlled. She had Coumadin related quite uropathy with INR >7. However she did not have any overt bleeding. She was treated with a single dose of vitamin K and INR improved to less than 4. Foley catheter was placed secondary to acute urinary retention. Despite all these aggressive measures, patient did not make any substantial improvement since admission on 09/20/14. In fact she has declined in many ways. Her oral intake has significantly decreased. On 09/22/14. Met with patient's 3 sons including Fritz Pickerel (HCPOA), Shanon Brow in person while Delfino Lovett was on speaker phone. Discussed at length regarding patient's overall poor prognosis given her advanced age, frail physical status, multiple significant acute medical conditions and poor baseline physical and mental status. They expressed that she would not have wished to continue aggressive care in current condition and they decided to transition her to full comfort care. Thereby lab work, antibiotic and medications nonessential to comfort were discontinued. Recommend continuing oxygen and Foley catheter for comfort. She will be discharged to residential hospice for symptom management including dyspnea, pain and agitation. Her prognosis is expected to be less than 2  weeks.  Consultations:  None  Procedures:  Foley catheter    Discharge Exam:  Complaints:  Nonverbal. As per nursing, no significant agitation overnight after dose of Haldol yesterday afternoon. Continues to get hypoxic on oxygen via nasal cannula-mouth breather.  Filed Vitals:   09/22/14 0400 09/22/14 0731 09/22/14 0800 09/22/14 1200  BP: 127/88  138/66 126/86  Pulse: 60  53   Temp: 97.2 F (36.2 C)  98.2 F (36.8 C)   TempSrc: Core (Comment)     Resp: _0 Height:      Weight: 50.8 kg (111 lb 15.9 oz)     SpO2: 92% 91% 92%     General exam: Small built and frail elderly female lying comfortably propped up in bed with no obvious distress. Respiratory system: Course and reduced breath sounds bilaterally with scattered bilateral few rhonchi. No increased work of breathing. Cardiovascular system: S1 & S2 heard, irregularly irregular. No murmurs, gallops, clicks. No JVD or ankle edema. Telemetry: A. fib with controlled ventricular rate. Gastrointestinal system: Abdomen is nondistended, soft and nontender. Normal bowel sounds heard. Central nervous system: Somnolent, easily arousable but drifts right back to sleep. No focal neurological deficits. Extremities: Symmetric 5 x 5 power.  Discharge Instructions      Discharge Instructions    Call MD for:  difficulty breathing, headache or visual disturbances    Complete by:  As directed      Call MD for:  persistant nausea and vomiting    Complete by:  As directed      Call MD for:  severe uncontrolled pain    Complete by:  As directed      Call MD for:  temperature >100.4    Complete by:  As directed      Discharge instructions    Complete by:  As directed   1) DIET: Comfort feeds/diet of choice. 2) Oxygen via nasal cannula at 3-4 L/m. Titrate to comfort.     Increase activity slowly    Complete by:  As directed             Medication List    STOP taking these medications        amLODipine 5 MG tablet   Commonly known as:  NORVASC     calcium carbonate 500 MG chewable tablet  Commonly known as:  TUMS - dosed in mg elemental calcium     CALTRATE 600 PLUS-VIT D PO     folic acid 1 MG tablet  Commonly known as:  FOLVITE     hydrocortisone valerate cream 0.2 %  Commonly known as:  WESTCORT     hydrOXYzine 25 MG tablet  Commonly known as:  ATARAX/VISTARIL     ICAPS AREDS FORMULA PO     lisinopril 5 MG tablet  Commonly known as:  PRINIVIL,ZESTRIL     magnesium oxide 400 MG tablet  Commonly known as:  MAG-OX     metroNIDAZOLE 0.75 % cream  Commonly known as:  METROCREAM     omeprazole 20 MG capsule  Commonly known as:  PRILOSEC     pravastatin 80 MG tablet  Commonly known as:  PRAVACHOL     warfarin 1 MG tablet  Commonly known as:  COUMADIN     warfarin 2.5 MG tablet  Commonly known as:  COUMADIN      TAKE these medications        acetaminophen 500 MG tablet  Commonly known as:  TYLENOL  Take 1 tablet (500 mg total) by mouth every  6 (six) hours as needed for fever.     albuterol (2.5 MG/3ML) 0.083% nebulizer solution  Commonly known as:  PROVENTIL  Take 2.5 mg by nebulization every 4 (four) hours as needed for wheezing or shortness of breath.     benzonatate 200 MG capsule  Commonly known as:  TESSALON  Take 1 capsule (200 mg total) by mouth 3 (three) times daily.     carvedilol 3.125 MG tablet  Commonly known as:  COREG  Take 3.125 mg by mouth 2 (two) times daily with a meal.     donepezil 10 MG tablet  Commonly known as:  ARICEPT  Take 10 mg by mouth every morning.     escitalopram 10 MG tablet  Commonly known as:  LEXAPRO  Take 10 mg by mouth daily.     feeding supplement (ENSURE COMPLETE) Liqd  Take 237 mLs by mouth 3 (three) times daily with meals.     guaifenesin 100 MG/5ML syrup  Commonly known as:  ROBITUSSIN  Take 200 mg by mouth every 6 (six) hours as needed for cough.     hydrocortisone cream 0.5 %  Apply 1 application topically 2 (two)  times daily as needed for itching (to forehead).     hydroxypropyl methylcellulose / hypromellose 2.5 % ophthalmic solution  Commonly known as:  ISOPTO TEARS / GONIOVISC  Place 1 drop into both eyes 3 (three) times daily.     LORazepam 2 MG/ML concentrated solution  Commonly known as:  ATIVAN  Take 0.5-1 mLs (1-2 mg total) by mouth every 4 (four) hours as needed for anxiety (agitation).     mirtazapine 15 MG tablet  Commonly known as:  REMERON  Take 15 mg by mouth at bedtime.     morphine CONCENTRATE 10 MG/0.5ML Soln concentrated solution  Take 0.5 mLs (10 mg total) by mouth every 4 (four) hours as needed for moderate pain, severe pain or shortness of breath.     protein supplement 6 g Powd  Commonly known as:  RESOURCE BENEPROTEIN  Take 2 scoop (12 g total) by mouth 3 (three) times daily with meals.          The results of significant diagnostics from this hospitalization (including imaging, microbiology, ancillary and laboratory) are listed below for reference.    Significant Diagnostic Studies: Dg Chest 2 View  09/20/2014   CLINICAL DATA:  Shortness of breath. Coughing since Wednesday or longer. Previous smoker.  EXAM: CHEST  2 VIEW  COMPARISON:  06/10/2014  FINDINGS: Cardiac enlargement without vascular congestion or edema. Small bilateral pleural effusions with basilar atelectasis. No focal consolidation in the lungs. No pneumothorax. Calcified and tortuous aorta. Old fracture deformity of the right humerus.  IMPRESSION: Cardiac enlargement. Small bilateral pleural effusions with basilar atelectasis.   Electronically Signed   By: Lucienne Capers M.D.   On: 09/20/2014 03:40   US Renal Port  09/20/2014   CLINICAL DATA:  Acute renal injury  EXAM: RENAL/URINARY TRACT ULTRASOUND COMPLETE  COMPARISON:  None.  FINDINGS: Right Kidney:  Length: 10.6 cm. Isoechoic to the adjacent liver. No mass or hydronephrosis visualized.  Left Kidney:  Length: 6.9 cm. Echogenic parenchyma. 8 x 6 x 9  mm hypoechoic lesion with low-level internal echoes in the upper pole. No hydronephrosis.  Bladder:  Appears normal for degree of bladder distention.  Bilateral pleural effusions noted.  IMPRESSION: 1. Negative for hydronephrosis. 2. Small atrophic echogenic left kidney with 75m complex upper pole cystic lesion. 3. Bilateral pleural effusions  Electronically Signed   By: Arne Cleveland M.D.   On: 09/20/2014 11:15   Dg Chest Port 1 View  09/21/2014   CLINICAL DATA:  Pneumonia.  COPD.  EXAM: PORTABLE CHEST - 1 VIEW  COMPARISON:  09/20/2014.  FINDINGS: Mediastinum and hilar structures are normal. Cardiomegaly with diffuse bilateral interstitial prominence. Left lower lobe atelectasis and/or infiltrate. Small right pleural effusion noted. Left costophrenic angle not imaged. No pneumothorax. No acute osseous abnormality.  IMPRESSION: 1. Cardiomegaly with bilateral pulmonary interstitial prominence and right-sided small pleural effusion. These findings suggest congestive heart failure. 2. Left lower lobe atelectasis and/or infiltrate.   Electronically Signed   By: Marcello Moores  Register   On: 09/21/2014 07:10    Microbiology: Recent Results (from the past 240 hour(s))  Culture, blood (routine x 2)     Status: None (Preliminary result)   Collection Time: 09/20/14  4:00 AM  Result Value Ref Range Status   Specimen Description BLOOD LEFT HAND  Final   Special Requests BOTTLES DRAWN AEROBIC AND ANAEROBIC 5ML  Final   Culture   Final           BLOOD CULTURE RECEIVED NO GROWTH TO DATE CULTURE WILL BE HELD FOR 5 DAYS BEFORE ISSUING A FINAL NEGATIVE REPORT Performed at [facility]    Report Status PENDING  Incomplete  Culture, blood (routine x 2)     Status: None (Preliminary result)   Collection Time: 09/20/14  4:05 AM  Result Value Ref Range Status   Specimen Description BLOOD LEFT SIDE  Final   Special Requests BOTTLES DRAWN AEROBIC AND ANAEROBIC 3ML  Final   Culture   Final           BLOOD CULTURE  RECEIVED NO GROWTH TO DATE CULTURE WILL BE HELD FOR 5 DAYS BEFORE ISSUING A FINAL NEGATIVE REPORT Performed at [facility]    Report Status PENDING  Incomplete  MRSA PCR Screening     Status: None   Collection Time: 09/20/14  6:40 AM  Result Value Ref Range Status   MRSA by PCR NEGATIVE NEGATIVE Final    Comment:        The GeneXpert MRSA Assay (FDA approved for NASAL specimens only), is one component of a comprehensive MRSA colonization surveillance program. It is not intended to diagnose MRSA infection nor to guide or monitor treatment for MRSA infections.      Labs: Basic Metabolic Panel:  Recent Labs Lab 09/20/14 0348 09/20/14 0409 09/20/14 1657 09/21/14 0419 09/22/14 0403  NA 140 142 144 139 143  K 5.2* 5.2* 5.1 4.8 4.0  CL 99 98 100 98 96  CO2 38*  --  39* 36* 36*  GLUCOSE 110* 110* 171* 166* 124*  BUN 40* 42* 44* 52* 51*  CREATININE 1.55* 1.50* 1.73* 1.94* 1.95*  CALCIUM 9.1  --  8.5 8.0* 7.7*   Liver Function Tests:  Recent Labs Lab 09/20/14 0348  AST 49*  ALT 42*  ALKPHOS 70  BILITOT 0.8  PROT 5.9*  ALBUMIN 3.8   No results for input(s): LIPASE, AMYLASE in the last 168 hours. No results for input(s): AMMONIA in the last 168 hours. CBC:  Recent Labs Lab 09/20/14 0347 09/20/14 0409 09/21/14 0419 09/22/14 0403  WBC 5.7  --  5.0 7.4  NEUTROABS 4.0  --  4.6  --   HGB 13.3 15.3* 12.6 12.2  HCT 45.0 45.0 42.9 40.6  MCV 91.8  --  92.5 89.6  PLT 159  --  142* 134*   Cardiac Enzymes:  Recent Labs Lab 09/20/14 0703 09/20/14 1320 09/20/14 1837  TROPONINI 0.03 0.03 0.03   BNP: BNP (last 3 results)  Recent Labs  09/20/14 0348  BNP 1425.4*    ProBNP (last 3 results) No results for input(s): PROBNP in the last 8760 hours.  CBG: No results for input(s): GLUCAP in the last 168 hours.      Signed:  Vernell Leep, MD, FACP, FHM. [facility] Pager [phone]  If 7PM-7AM, please contact  night-coverage [email or web address] Password [email or web address] 09/22/2014, 1:12 PM

## 2014-09-26 LAB — CULTURE, BLOOD (ROUTINE X 2)
CULTURE: NO GROWTH
CULTURE: NO GROWTH

## 2014-10-20 DEATH — deceased

## 2015-06-29 IMAGING — US US RENAL PORT
1 series · 14 of 25 positions shown · non-contrast
Comparison: None.

CLINICAL DATA: Acute renal injury

EXAM:
RENAL/URINARY TRACT ULTRASOUND COMPLETE

[Series 1: us renal port · 0.22mm/px · 14 of 37 slices shown]
[im 1/37]
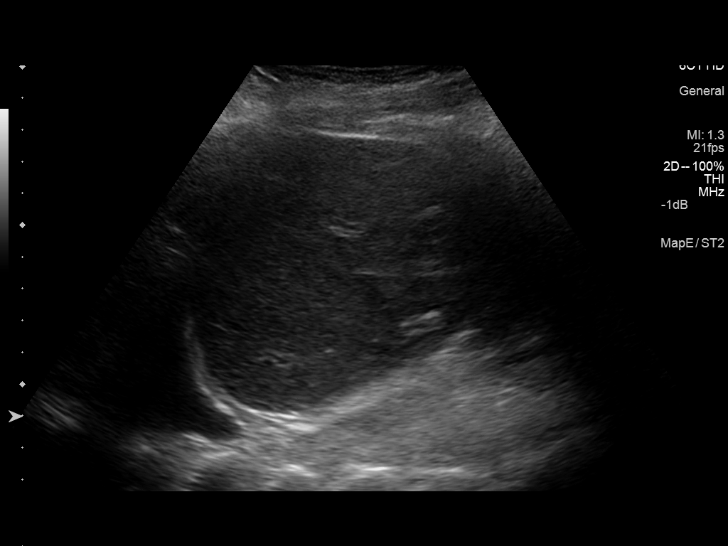
[im 4/37]
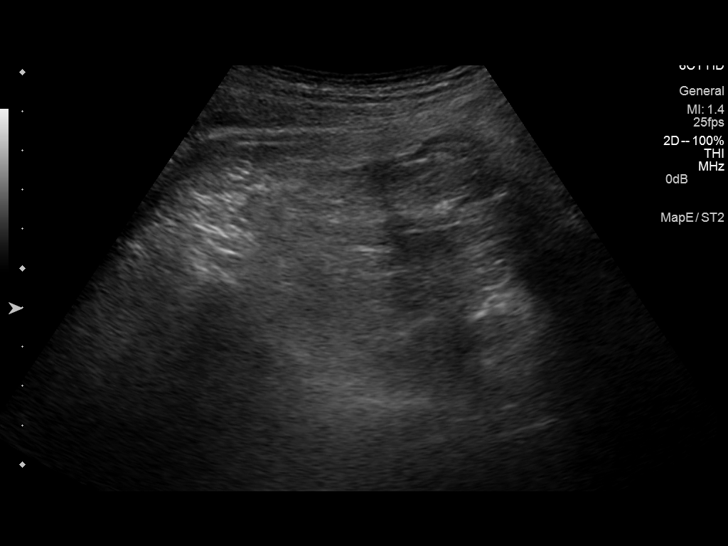
[im 7/37]
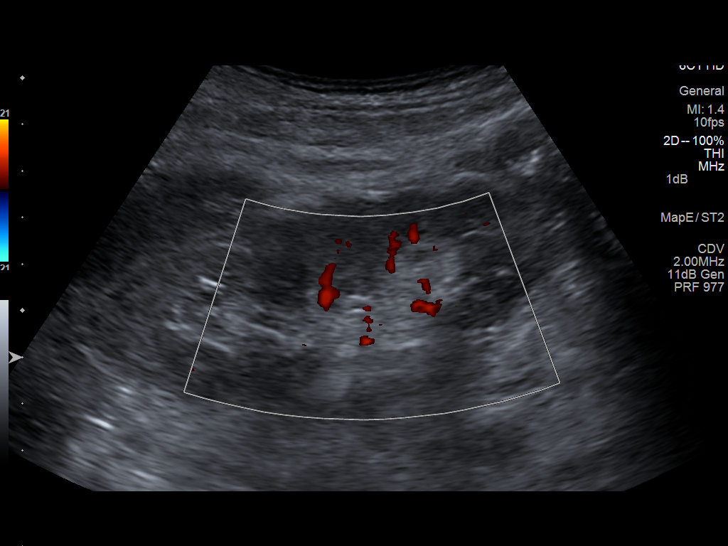
[im 10/37]
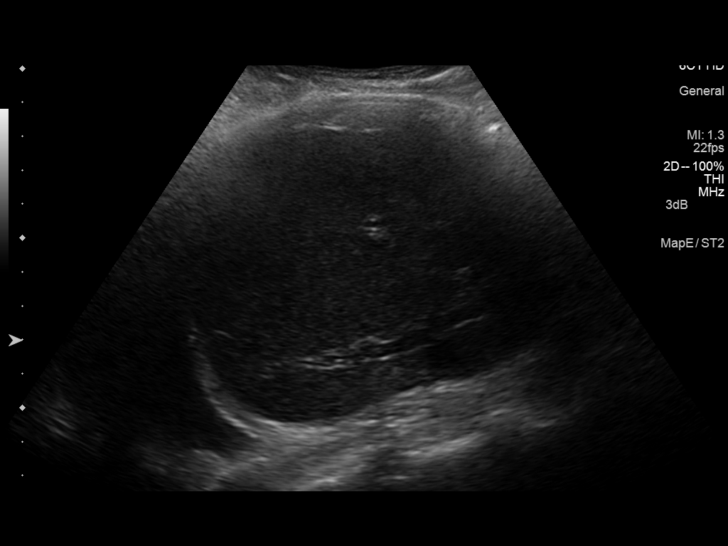
[im 13/37]
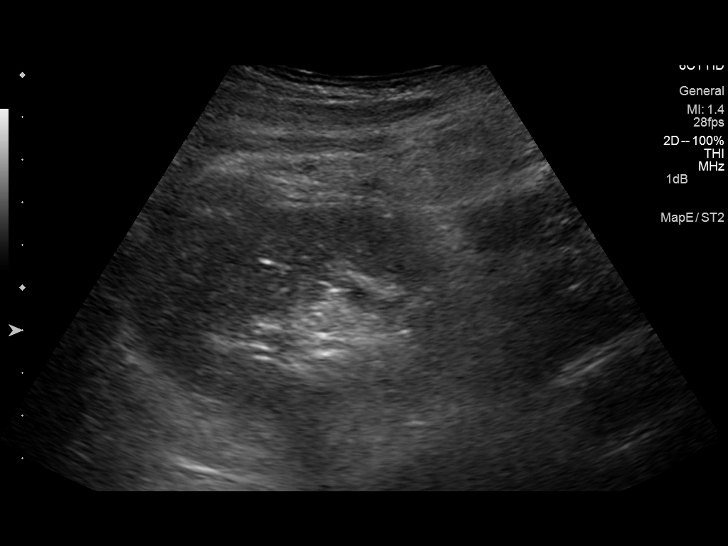
[im 14/37]
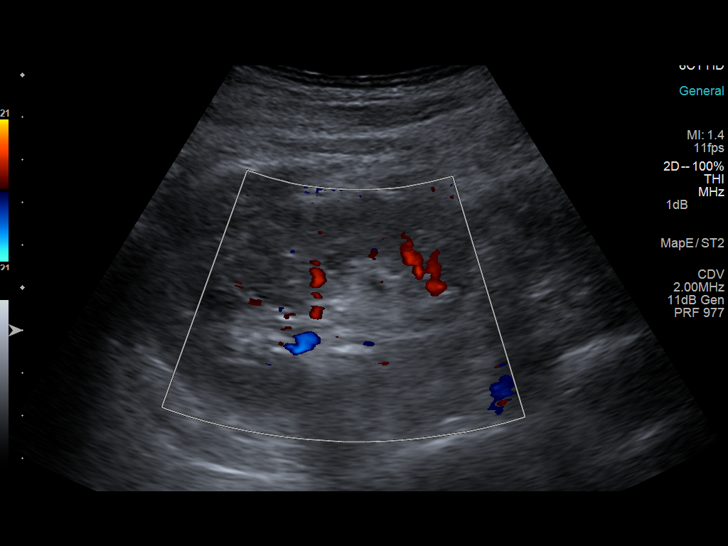
[im 17/37]
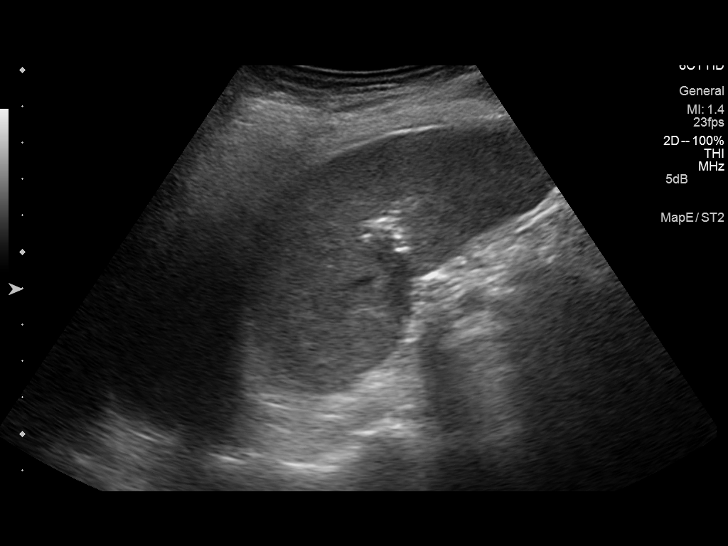
[im 20/37]
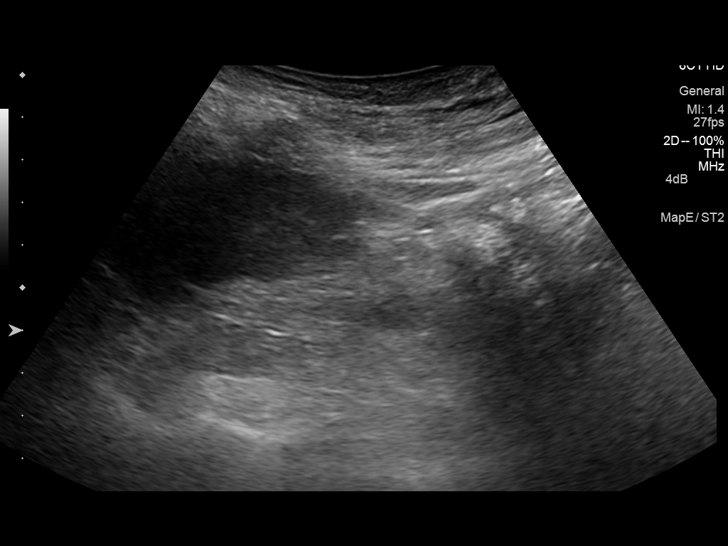
[im 23/37]
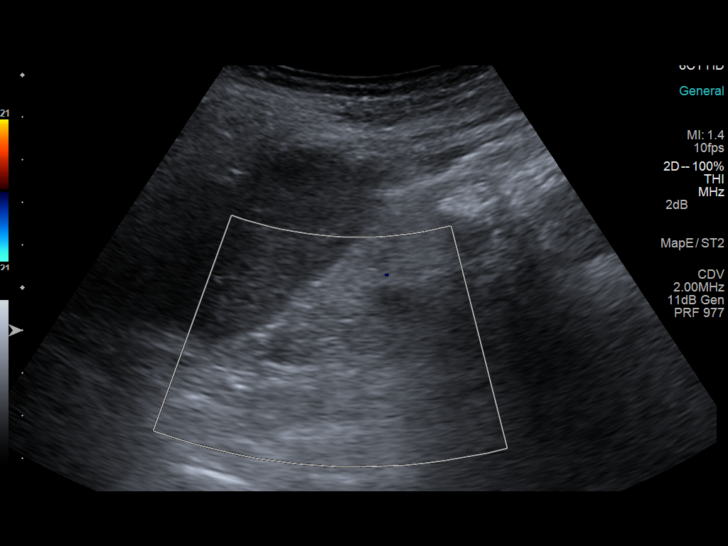
[im 25/37]
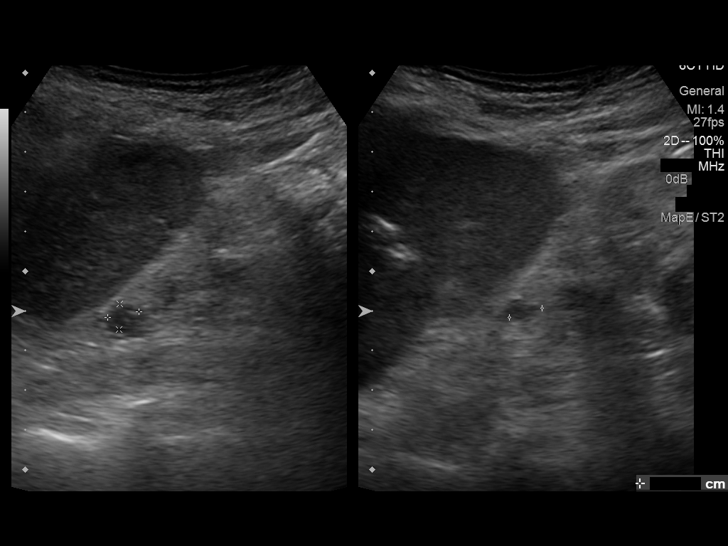
[im 28/37]
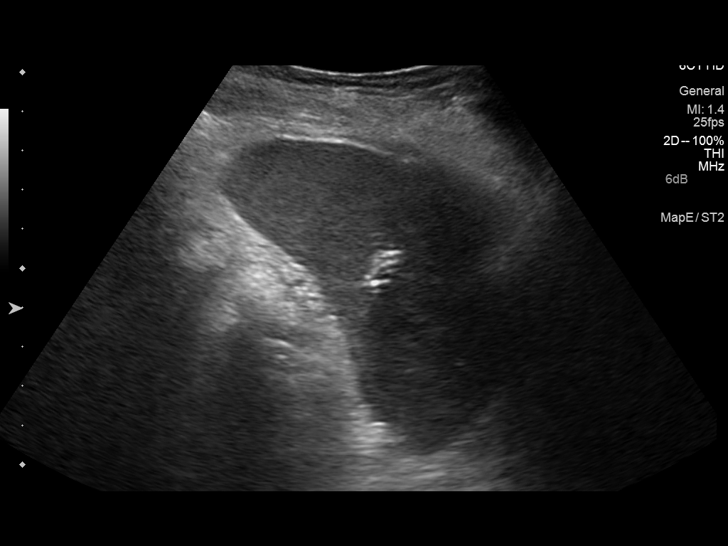
[im 31/37]
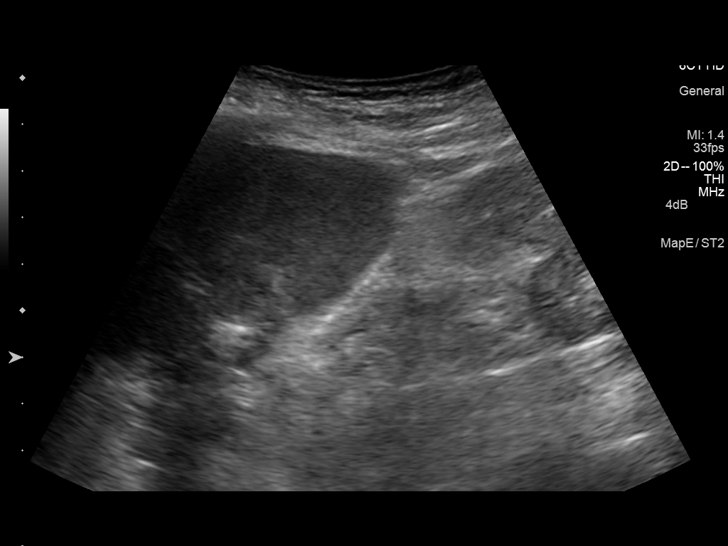
[im 34/37]
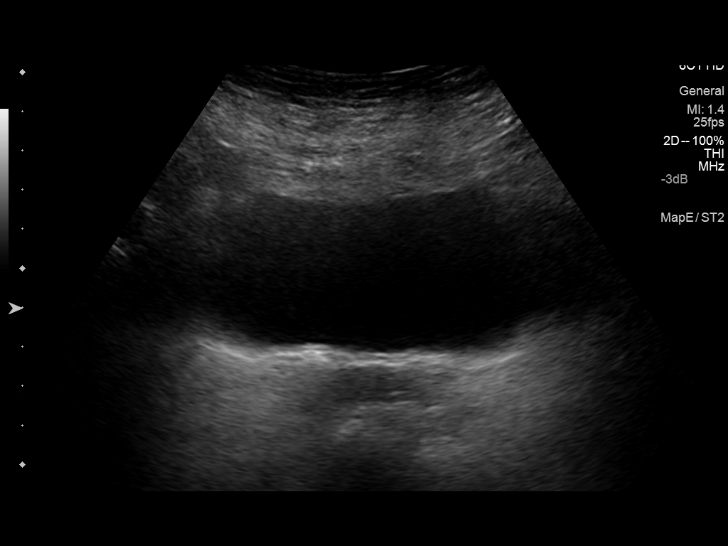
[im 37/37]
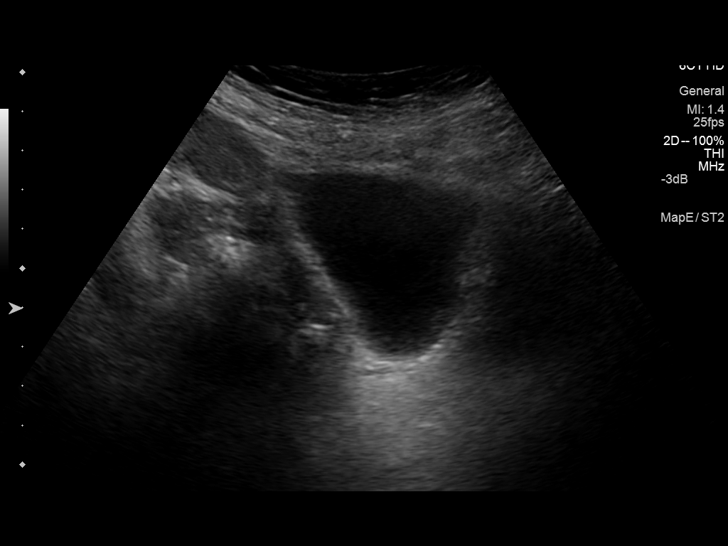

[14 of 25 positions shown; findings below may reference images not displayed]

FINDINGS: Right Kidney:

Length: 10.6 cm.. Isoechoic to the adjacent liver. No mass or
hydronephrosis visualized.

Left Kidney:

Length: 6.9 cm. Echogenic parenchyma. 8 x 6 x 9 mm hypoechoic lesion
with low-level internal echoes in the upper pole. No
hydronephrosis..

Bladder:

Appears normal for degree of bladder distention.

Bilateral pleural effusions noted.
IMPRESSION: 1. Negative for hydronephrosis.
2. Small atrophic echogenic left kidney with 9mm complex upper pole
cystic lesion.
3. Bilateral pleural effusions

## 2015-06-30 IMAGING — DX DG CHEST 1V PORT
1 series · 1 of 1 positions shown · non-contrast
Comparison: 09/20/2014.

CLINICAL DATA: Pneumonia.  COPD.

EXAM:
PORTABLE CHEST - 1 VIEW

[chest ap]
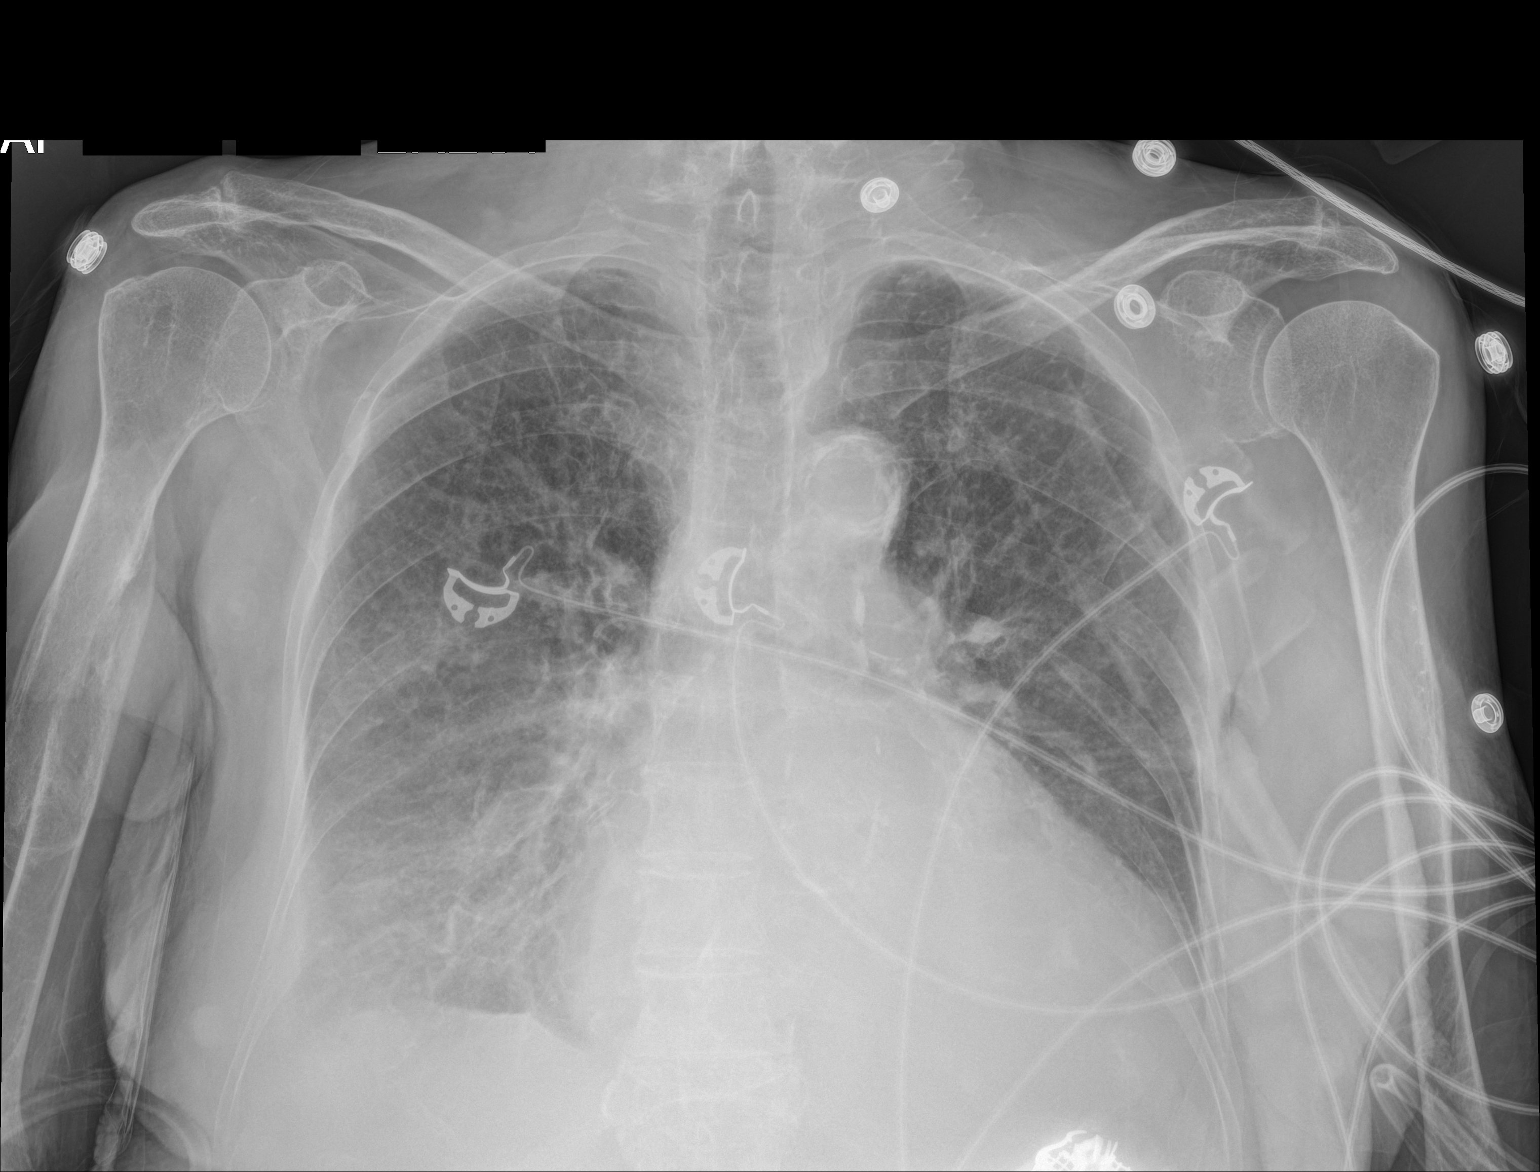

[1 of 1 positions shown; findings below may reference images not displayed]

FINDINGS: Mediastinum and hilar structures are normal. Cardiomegaly with
diffuse bilateral interstitial prominence. Left lower lobe
atelectasis and/or infiltrate. Small right pleural effusion noted.
Left costophrenic angle not imaged. No pneumothorax. No acute
osseous abnormality.
IMPRESSION: 1. Cardiomegaly with bilateral pulmonary interstitial prominence and
right-sided small pleural effusion. These findings suggest
congestive heart failure.
2. Left lower lobe atelectasis and/or infiltrate.
